# Patient Record
Sex: Male | Born: 1945 | Race: White | Hispanic: No | Marital: Married | Smoking: Never smoker
Health system: Southern US, Community
[De-identification: ages and names within clinical notes are randomized; demographics above are authoritative.]

## PROBLEM LIST (undated history)

## (undated) DIAGNOSIS — L570 Actinic keratosis: Secondary | ICD-10-CM

## (undated) HISTORY — PX: CHOLECYSTECTOMY: SHX55

## (undated) HISTORY — DX: Actinic keratosis: L57.0

---

## 2011-10-01 DIAGNOSIS — R972 Elevated prostate specific antigen [PSA]: Secondary | ICD-10-CM | POA: Insufficient documentation

## 2018-03-22 ENCOUNTER — Emergency Department: Payer: Medicare HMO

## 2018-03-22 ENCOUNTER — Emergency Department
Admission: EM | Admit: 2018-03-22 | Discharge: 2018-03-22 | Disposition: A | Discharge status: Home / Self Care | Payer: Medicare HMO | Attending: Emergency Medicine | Admitting: Emergency Medicine

## 2018-03-22 ENCOUNTER — Encounter: Payer: Self-pay | Admitting: Emergency Medicine

## 2018-03-22 DIAGNOSIS — M62830 Muscle spasm of back: Secondary | ICD-10-CM | POA: Diagnosis not present

## 2018-03-22 DIAGNOSIS — R1031 Right lower quadrant pain: Secondary | ICD-10-CM | POA: Diagnosis present

## 2018-03-22 LAB — URINALYSIS, COMPLETE (UACMP) WITH MICROSCOPIC
Bacteria, UA: NONE SEEN
Bilirubin Urine: NEGATIVE
GLUCOSE, UA: NEGATIVE mg/dL
Hgb urine dipstick: NEGATIVE
Ketones, ur: NEGATIVE mg/dL
Leukocytes, UA: NEGATIVE
Nitrite: NEGATIVE
PROTEIN: NEGATIVE mg/dL
SPECIFIC GRAVITY, URINE: 1.015 (ref 1.005–1.030)
Squamous Epithelial / LPF: NONE SEEN (ref 0–5)
pH: 5 (ref 5.0–8.0)

## 2018-03-22 LAB — CBC
HEMATOCRIT: 37.4 % — AB (ref 40.0–52.0)
HEMOGLOBIN: 13.1 g/dL (ref 13.0–18.0)
MCH: 31.3 pg (ref 26.0–34.0)
MCHC: 35.1 g/dL (ref 32.0–36.0)
MCV: 89 fL (ref 80.0–100.0)
Platelets: 151 10*3/uL (ref 150–440)
RBC: 4.2 MIL/uL — ABNORMAL LOW (ref 4.40–5.90)
RDW: 14.5 % (ref 11.5–14.5)
WBC: 6.2 10*3/uL (ref 3.8–10.6)

## 2018-03-22 LAB — BASIC METABOLIC PANEL
Anion gap: 8 (ref 5–15)
BUN: 21 mg/dL — ABNORMAL HIGH (ref 6–20)
CHLORIDE: 105 mmol/L (ref 101–111)
CO2: 25 mmol/L (ref 22–32)
Calcium: 8.9 mg/dL (ref 8.9–10.3)
Creatinine, Ser: 0.95 mg/dL (ref 0.61–1.24)
GFR calc Af Amer: >60 mL/min (ref >60)
GLUCOSE: 104 mg/dL — AB (ref 65–99)
POTASSIUM: 4.2 mmol/L (ref 3.5–5.1)
SODIUM: 138 mmol/L (ref 135–145)

## 2018-03-22 IMAGING — CT CT RENAL STONE PROTOCOL
2 of 4 series · 16 of 46 positions shown, 18 images · non-contrast
Comparison: None.

CLINICAL DATA: Right flank pain

EXAM:
CT ABDOMEN AND PELVIS WITHOUT CONTRAST
TECHNIQUE: Multidetector CT imaging of the abdomen and pelvis was performed
following the standard protocol without IV contrast.

[Series 2: stone full standard · axial · 0.80mm/px · z∈[-1152,-702]mm · 13 of 100 slices shown, 15 images]
[im 5/100  soft-tissue]
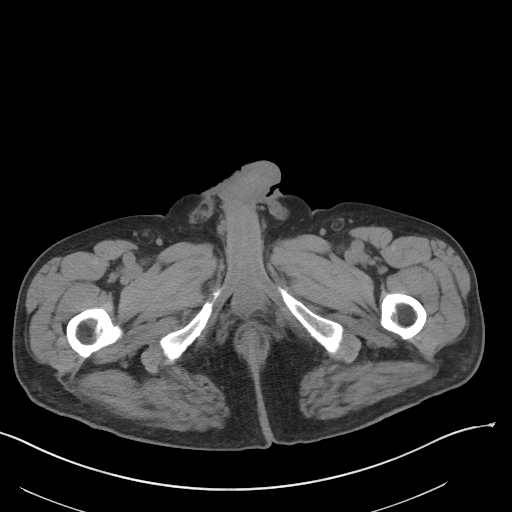
[im 5/100  bone]
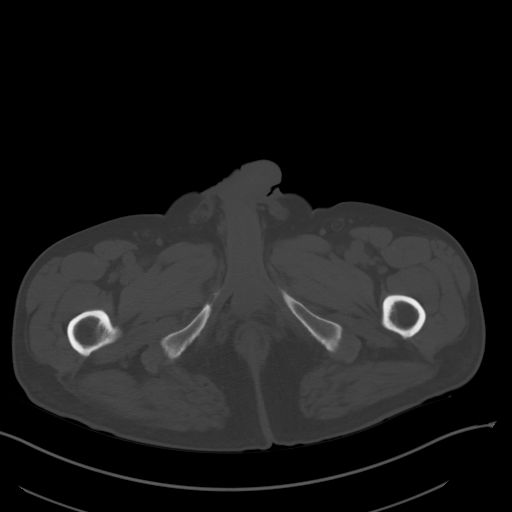
[im 13/100  soft-tissue]
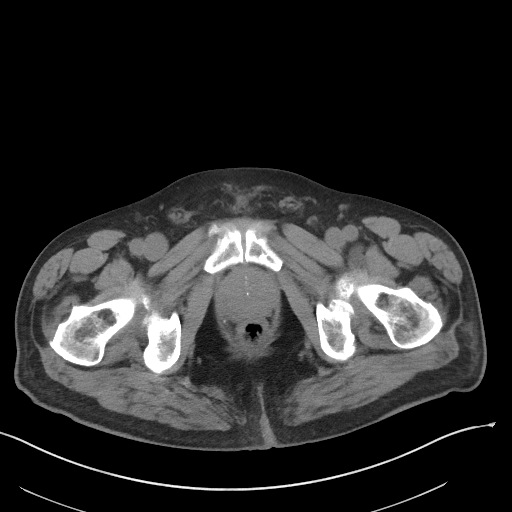
[im 21/100  soft-tissue]
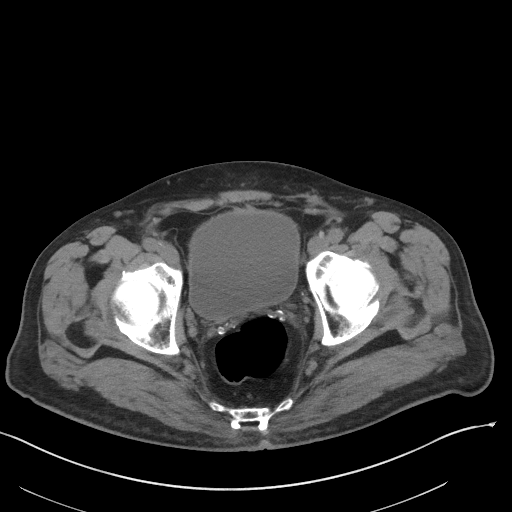
[im 29/100  soft-tissue]
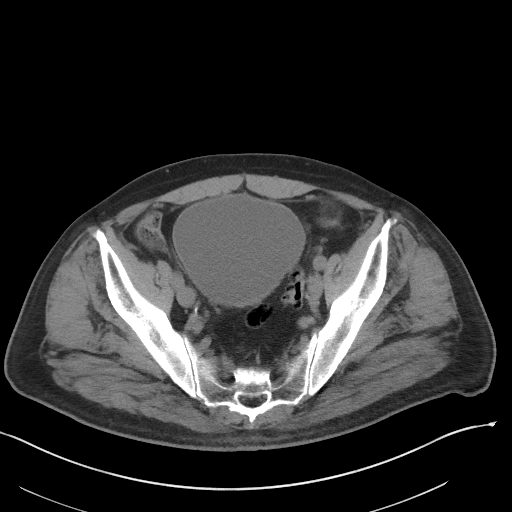
[im 34/100  soft-tissue]
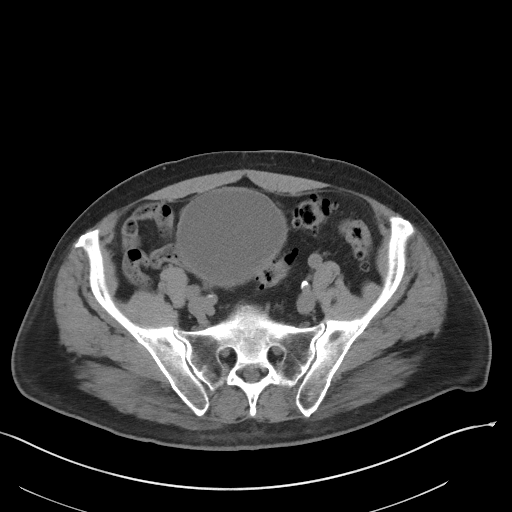
[im 42/100  soft-tissue]
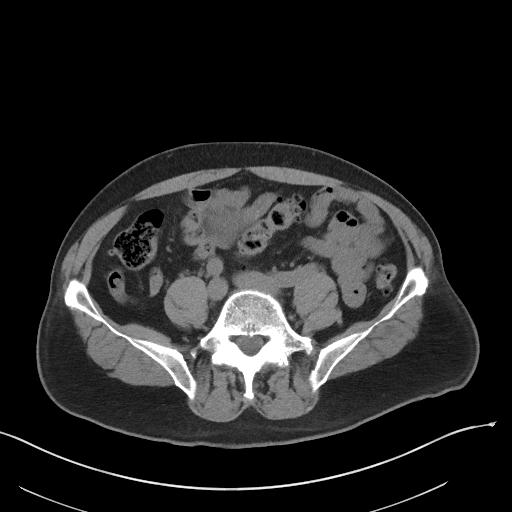
[im 50/100  soft-tissue]
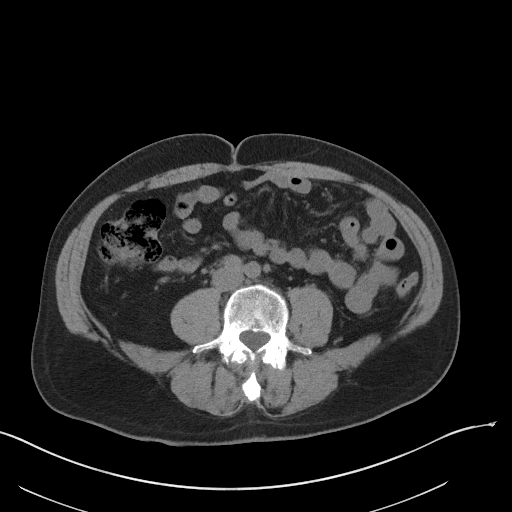
[im 58/100  soft-tissue]
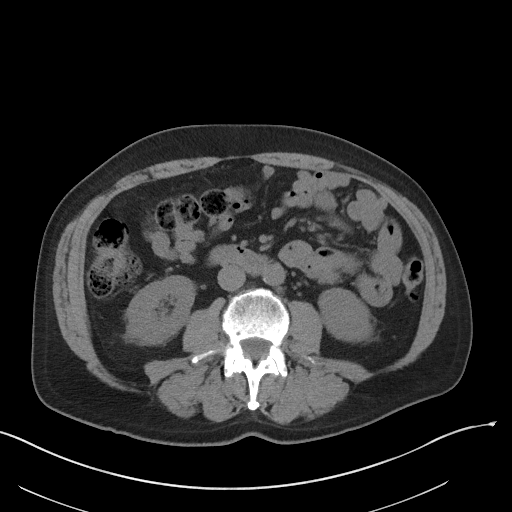
[im 67/100  soft-tissue]
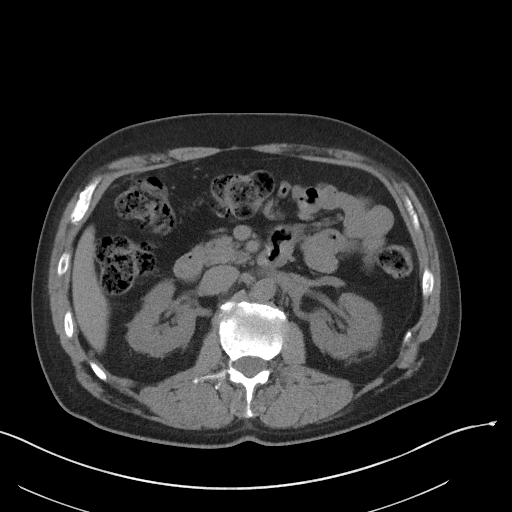
[im 67/100  bone]
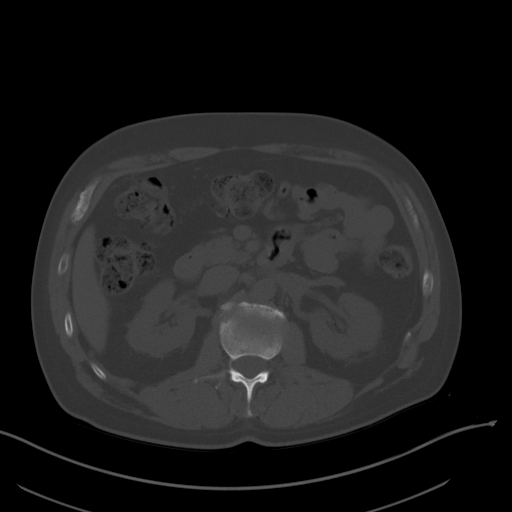
[im 71/100  soft-tissue]
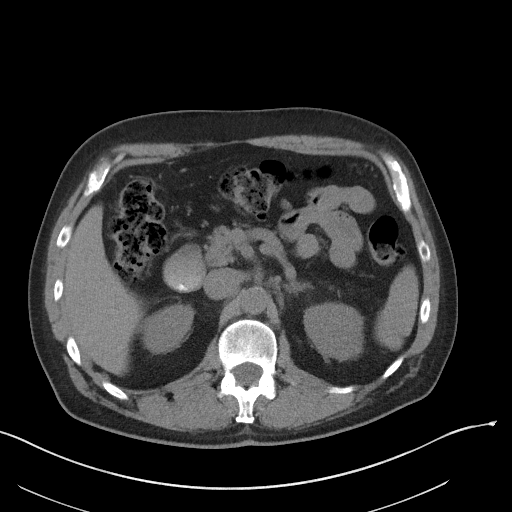
[im 79/100  soft-tissue]
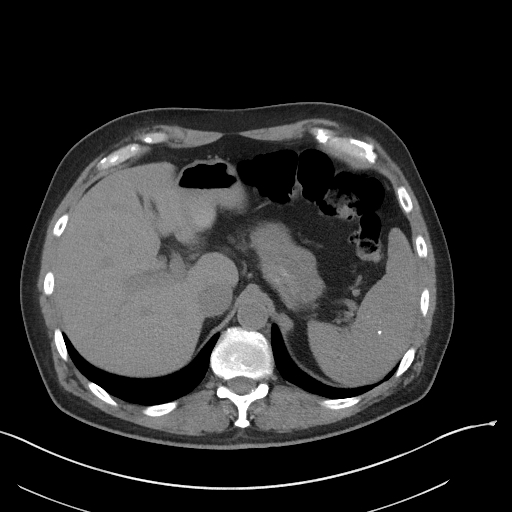
[im 87/100  soft-tissue]
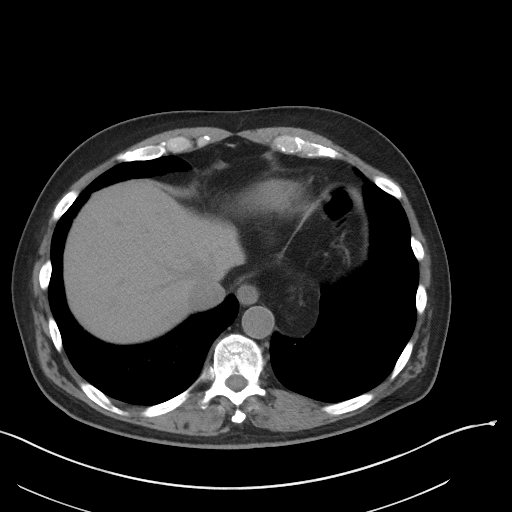
[im 95/100  soft-tissue]
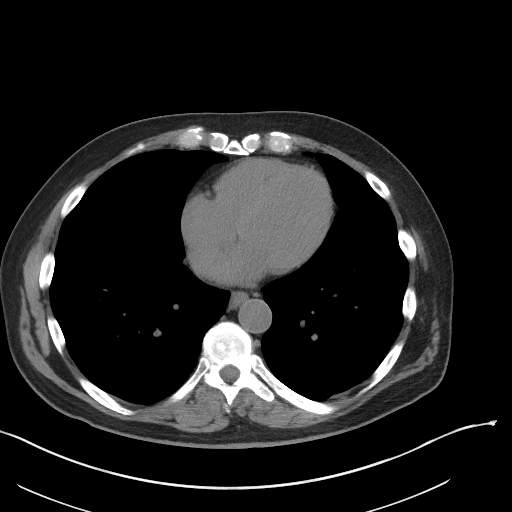

[Series 5: coronal · coronal · 0.84mm/px · 3 of 129 slices shown]
[im 43/129  soft-tissue]
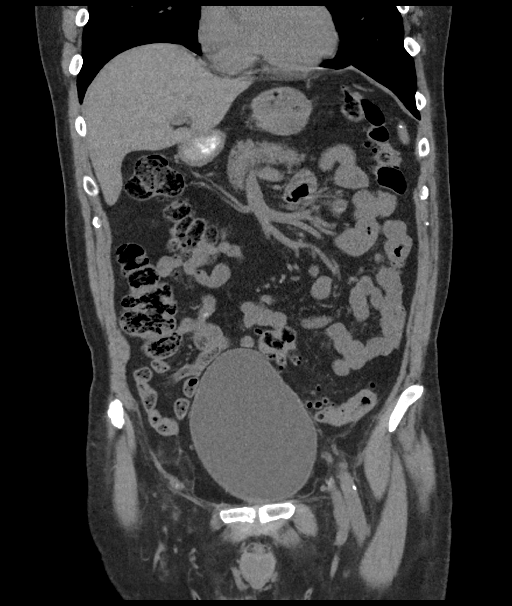
[im 57/129  soft-tissue]
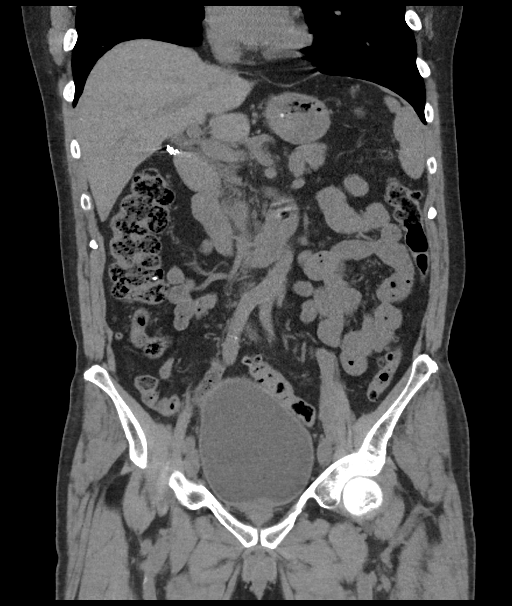
[im 72/129  soft-tissue]
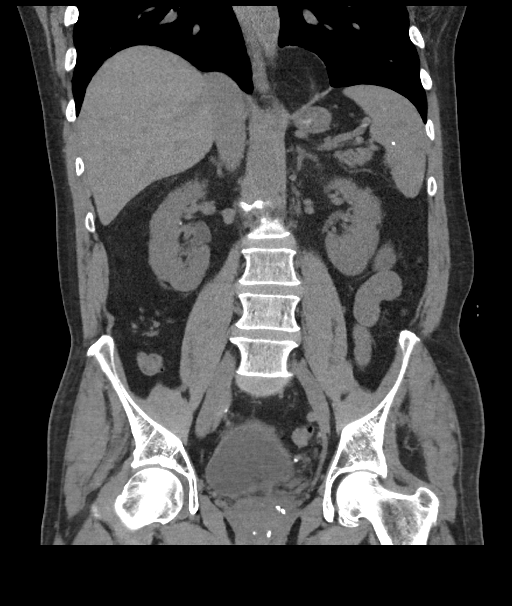

[16 of 46 positions shown; findings below may reference images not displayed]

FINDINGS: Lower chest: Lung bases are clear. No effusions. Heart is normal
size.

Hepatobiliary: No focal liver abnormality is seen. Status post
cholecystectomy. No biliary dilatation.

Pancreas: No focal abnormality or ductal dilatation.

Spleen: Calcifications throughout the spleen.  Normal size.

Adrenals/Urinary Tract: Punctate nonobstructing stone in the midpole
of the right kidney. No hydronephrosis. No ureteral stones. Urinary
bladder and adrenal glands unremarkable.

Stomach/Bowel: Normal appendix. Diffuse colonic diverticulosis.
Slight stranding around the proximal sigmoid colon anteriorly in the
left lower quadrant could reflect early acute diverticulitis.
Stomach and small bowel decompressed, unremarkable.

Vascular/Lymphatic: No evidence of aneurysm or adenopathy.

Reproductive: Prostate enlargement with central calcifications.

Other: No free fluid or free air.

Musculoskeletal: No acute bony abnormality.
IMPRESSION: Punctate right nephrolithiasis. No ureteral stones or
hydronephrosis.

Diffuse colonic diverticulosis, most pronounced in the left colon.
Slight haziness noted anteriorly adjacent to the proximal sigmoid
colon in the left lower quadrant could reflect early active
diverticulitis.

Prostate enlargement with calcifications.

Old granulomatous disease of the spleen.

## 2018-03-22 MED ORDER — OXYCODONE-ACETAMINOPHEN 5-325 MG PO TABS
ORAL_TABLET | ORAL | Status: AC
  Administered 2018-03-22: 1 via ORAL
  Filled 2018-03-22: qty 1

## 2018-03-22 MED ORDER — OXYCODONE-ACETAMINOPHEN 5-325 MG PO TABS
1.0000 | ORAL_TABLET | Freq: Once | ORAL | Status: AC
  Administered 2018-03-22: 1 via ORAL

## 2018-03-22 MED ORDER — KETOROLAC TROMETHAMINE 30 MG/ML IJ SOLN
30.0000 mg | Freq: Once | INTRAMUSCULAR | Status: AC
  Administered 2018-03-22: 30 mg via INTRAMUSCULAR
  Filled 2018-03-22: qty 1

## 2018-03-22 MED ORDER — CYCLOBENZAPRINE HCL 5 MG PO TABS: 5.0000 mg | ORAL_TABLET | Freq: Three times a day (TID) | ORAL | 0 refills | Status: DC | PRN

## 2018-03-22 NOTE — ED Provider Notes (Signed)
[facility] Emergency Department Provider Note  ____________________________________________   I have reviewed the triage vital signs and the nursing notes. Where available I have reviewed prior notes and, if possible and indicated, outside hospital notes.    HISTORY  Chief Complaint Flank Pain    HPI Vincent Knapp is a 72 y.o. male who presents today with back spasms in the lower back.  No radicular symptoms.  No radiation.  No numbness no weakness no abdominal pain, he is pain-free unless he moves wrong way that he is a sharp pain.  Is in the lower right back.  He states it began gradually 2 days ago.  Initially Tylenol helped but now Tylenol is not helping.  A very brief spasms of pain depending on awkward motion.  No recollected trauma but the day before it started he was doing a weight regimen and he feels that he "tweaked" his back.  He has had back pain before but not to this extent.  He denies any incontinence of bowel or bladder or any other new or worrisome symptoms of that variety.  Nothing makes it better except for staying still nothing makes it worse except for moving the wrong way.    History reviewed. No pertinent past medical history.  There are no active problems to display for this patient.   Past Surgical History:  Procedure Laterality Date  . CHOLECYSTECTOMY      Prior to Admission medications   Not on File    Allergies Patient has no allergy information on record.  No family history on file.  Social History Social History   Tobacco Use  . Smoking status: Never Smoker  . Smokeless tobacco: Never Used  Substance Use Topics  . Alcohol use: Yes  . Drug use: Never    Review of Systems Constitutional: No fever/chills Eyes: No visual changes. ENT: No sore throat. No stiff neck no neck pain Cardiovascular: Denies chest pain. Respiratory: Denies shortness of breath. Gastrointestinal:   no vomiting.  No diarrhea.  No  constipation. Genitourinary: Negative for dysuria. Musculoskeletal: Negative lower extremity swelling Skin: Negative for rash. Neurological: Negative for severe headaches, focal weakness or numbness.   ____________________________________________   PHYSICAL EXAM:  VITAL SIGNS: ED Triage Vitals  Enc Vitals Group     BP 03/22/18 1840 (!) 141/73     Pulse Rate 03/22/18 1840 60     Resp 03/22/18 1840 16     Temp 03/22/18 1840 98 F (36.7 C)     Temp Source 03/22/18 1840 Oral     SpO2 03/22/18 1840 100 %     Weight 03/22/18 1838 166 lb (75.3 kg)     Height 03/22/18 1838 5' 6.5" (1.689 m)     Head Circumference --      Peak Flow --      Pain Score 03/22/18 1837 6     Pain Loc --      Pain Edu? --      Excl. in [place]? --     Constitutional: Alert and oriented. Well appearing and in no acute distress. Eyes: Conjunctivae are normal Head: Atraumatic HEENT: No congestion/rhinnorhea. Mucous membranes are moist.  Oropharynx non-erythematous Neck:   Nontender with no meningismus, no masses, no stridor Cardiovascular: Normal rate, regular rhythm. Grossly normal heart sounds.  Good peripheral circulation. Respiratory: Normal respiratory effort.  No retractions. Lungs CTAB. Abdominal: Soft and nontender. No distention. No guarding no rebound no AAA palpated Back: Tenderness palpation the in the paraspinal  muscles to the left side of the back, there is no midline tenderness there is no shingles there is no erythema there is no abscess or lesions noted.  This exactly reproduces his pain.  Pain is also reproduced when he changes position in the bed in the wrong way.  There is no midline tenderness there are no lesions noted. there is no CVA tenderness Normal external male genitalia Musculoskeletal: No lower extremity tenderness, no upper extremity tenderness. No joint effusions, no DVT signs strong distal pulses no edema Neurologic:  Normal speech and language. No gross focal neurologic deficits  are appreciated.  Skin:  Skin is warm, dry and intact. No rash noted. Psychiatric: Mood and affect are normal. Speech and behavior are normal.  ____________________________________________   LABS (all labs ordered are listed, but only abnormal results are displayed)  Labs Reviewed  URINALYSIS, COMPLETE (UACMP) WITH MICROSCOPIC - Abnormal; Notable for the following components:      Result Value   Color, Urine YELLOW (*)    APPearance CLEAR (*)    All other components within normal limits  BASIC METABOLIC PANEL - Abnormal; Notable for the following components:   Glucose, Bld 104 (*)    BUN 21 (*)    All other components within normal limits  CBC - Abnormal; Notable for the following components:   RBC 4.20 (*)    HCT 37.4 (*)    All other components within normal limits    Pertinent labs  results that were available during my care of the patient were reviewed by me and considered in my medical decision making (see chart for details). ____________________________________________  EKG  I personally interpreted any EKGs ordered by me or triage  ____________________________________________  RADIOLOGY  Pertinent labs & imaging results that were available during my care of the patient were reviewed by me and considered in my medical decision making (see chart for details). If possible, patient and/or family made aware of any abnormal findings.  Ct Renal Stone Study  Result Date: 03/22/2018 CLINICAL DATA:  Right flank pain EXAM: CT ABDOMEN AND PELVIS WITHOUT CONTRAST TECHNIQUE: Multidetector CT imaging of the abdomen and pelvis was performed following the standard protocol without IV contrast. COMPARISON:  None. FINDINGS: Lower chest: Lung bases are clear. No effusions. Heart is normal size. Hepatobiliary: No focal liver abnormality is seen. Status post cholecystectomy. No biliary dilatation. Pancreas: No focal abnormality or ductal dilatation. Spleen: Calcifications throughout the  spleen.  Normal size. Adrenals/Urinary Tract: Punctate nonobstructing stone in the midpole of the right kidney. No hydronephrosis. No ureteral stones. Urinary bladder and adrenal glands unremarkable. Stomach/Bowel: Normal appendix. Diffuse colonic diverticulosis. Slight stranding around the proximal sigmoid colon anteriorly in the left lower quadrant could reflect early acute diverticulitis. Stomach and small bowel decompressed, unremarkable. Vascular/Lymphatic: No evidence of aneurysm or adenopathy. Reproductive: Prostate enlargement with central calcifications. Other: No free fluid or free air. Musculoskeletal: No acute bony abnormality. IMPRESSION: Punctate right nephrolithiasis. No ureteral stones or hydronephrosis. Diffuse colonic diverticulosis, most pronounced in the left colon. Slight haziness noted anteriorly adjacent to the proximal sigmoid colon in the left lower quadrant could reflect early active diverticulitis. Prostate enlargement with calcifications. Old granulomatous disease of the spleen. Electronically Signed   By: Rolm Baptise M.D.   On: 03/22/2018 20:16   ____________________________________________    PROCEDURES  Procedure(s) performed: None  Procedures  Critical Care performed: None  ____________________________________________   INITIAL IMPRESSION / ASSESSMENT AND PLAN / ED COURSE  Pertinent labs & imaging results  that were available during my care of the patient were reviewed by me and considered in my medical decision making (see chart for details).  Here with back spasms that come when he changes position the wrong way after lifting weights 2 days ago with no midline tenderness no radicular symptoms no vascular compromise indicated, normal neurologic exam with no saddle anesthesia no incontinence of bowel or bladder nothing to suggest cauda equina syndrome, no abdominal pain, nothing to suggest AAA but given his age I did a CT scan which is reassuring.  Nothing to  suggest diverticulitis clinically.  Patient is feeling much better after pain medication he still having occasional spasms but is feeling better we will send him home with muscle relaxers, I have advised him to be careful with muscle relaxers, and advised nonsteroidal pain medications to be taken with food and a ginger manner as needed.  Patient very comfortable with this.  There is nothing to suggest oncologic process or other life-threatening disease process return precautions and follow-up given and understood however    ____________________________________________   FINAL CLINICAL IMPRESSION(S) / ED DIAGNOSES  Final diagnoses:  None      This chart was dictated using voice recognition software.  Despite best efforts to proofread,  errors can occur which can change meaning.      Schuyler Amor, MD 03/22/18 2126

## 2018-03-22 NOTE — ED Triage Notes (Signed)
First nurse note-flank pain, feels like kidney stone. Appears uncomfortable but no current distress.

## 2018-03-22 NOTE — ED Triage Notes (Signed)
PT arrived with concerns over episodes of sharp flank pain. PT stated it started "a few days ago." Pt attempted OTC medication but had no relief. Pt in NAD in triage.

## 2018-03-22 NOTE — Discharge Instructions (Signed)
If you have any numbness, weakness, increased pain, abdominal pain, fever, vomiting, incontinence of bowel or bladder, or any other new or worrisome symptoms return to the emergency department.  Follow closely with primary care doctor.  Take the muscle relaxers with ibuprofen as directed.  Take the ibuprofen with food.  Take the minimal amount necessary.  You may also take Tylenol.  Do not drink or drive on muscle relaxers as they can cause you to be somnolent.

## 2018-03-22 NOTE — ED Notes (Signed)
ED Provider at bedside. 

## 2019-03-07 ENCOUNTER — Ambulatory Visit: Payer: Self-pay | Admitting: Podiatry

## 2019-12-28 ENCOUNTER — Ambulatory Visit: Payer: Medicare HMO | Attending: Internal Medicine

## 2019-12-28 DIAGNOSIS — Z23 Encounter for immunization: Secondary | ICD-10-CM | POA: Insufficient documentation

## 2019-12-28 NOTE — Progress Notes (Signed)
   Covid-19 Vaccination Clinic  Name:  Vincent Knapp    MRN: [id number] DOB: January 16, 1946  12/28/2019  [name] was observed post Covid-19 immunization for 15 minutes without incidence. He was provided with Vaccine Information Sheet and instruction to access the V-Safe system.   [name] was instructed to call 911 with any severe reactions post vaccine: Marland Kitchen Difficulty breathing  . Swelling of your face and throat  . A fast heartbeat  . A bad rash all over your body  . Dizziness and weakness    Immunizations Administered    Name Date Dose VIS Date Route   Pfizer COVID-19 Vaccine 12/28/2019  1:44 PM 0.3 mL 10/12/2019 Intramuscular   Manufacturer: [place]   Lot: [id number]   [place]: [id number]

## 2020-01-23 ENCOUNTER — Ambulatory Visit: Payer: Medicare HMO | Attending: Internal Medicine

## 2020-01-23 DIAGNOSIS — Z23 Encounter for immunization: Secondary | ICD-10-CM

## 2020-01-23 NOTE — Progress Notes (Signed)
   Covid-19 Vaccination Clinic  Name:  Vincent Knapp    MRN: [id number] DOB: Sep 06, 1946  01/23/2020  [name] was observed post Covid-19 immunization for 15 minutes without incident. He was provided with Vaccine Information Sheet and instruction to access the V-Safe system.   [name] was instructed to call 911 with any severe reactions post vaccine: Marland Kitchen Difficulty breathing  . Swelling of face and throat  . A fast heartbeat  . A bad rash all over body  . Dizziness and weakness   Immunizations Administered    Name Date Dose VIS Date Route   Pfizer COVID-19 Vaccine 01/23/2020  9:19 AM 0.3 mL 10/12/2019 Intramuscular   Manufacturer: [place]   Lot: [id number]   [place]: [id number]

## 2020-04-08 ENCOUNTER — Ambulatory Visit: Payer: Medicare HMO | Admitting: Dermatology

## 2020-04-08 ENCOUNTER — Other Ambulatory Visit: Payer: Self-pay

## 2020-04-08 DIAGNOSIS — L570 Actinic keratosis: Secondary | ICD-10-CM | POA: Diagnosis not present

## 2020-04-08 DIAGNOSIS — L578 Other skin changes due to chronic exposure to nonionizing radiation: Secondary | ICD-10-CM | POA: Diagnosis not present

## 2020-04-08 DIAGNOSIS — L821 Other seborrheic keratosis: Secondary | ICD-10-CM

## 2020-04-08 DIAGNOSIS — L814 Other melanin hyperpigmentation: Secondary | ICD-10-CM

## 2020-04-08 DIAGNOSIS — L905 Scar conditions and fibrosis of skin: Secondary | ICD-10-CM | POA: Diagnosis not present

## 2020-04-08 DIAGNOSIS — D1801 Hemangioma of skin and subcutaneous tissue: Secondary | ICD-10-CM
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
## 2020-04-08 NOTE — Patient Instructions (Addendum)
Recommend daily broad spectrum sunscreen SPF 30+ to sun-exposed areas, reapply every 2 hours as needed. Call for new or changing lesions.  Cryotherapy Aftercare  . Wash gently with soap and water everyday.   Marland Kitchen Apply Vaseline and Band-Aid daily until healed.   Seborrheic Keratosis  What causes seborrheic keratoses? Seborrheic keratoses are harmless, common skin growths that first appear during adult life.  As time goes by, more growths appear.  Some people may develop a large number of them.  Seborrheic keratoses appear on both covered and uncovered body parts.  They are not caused by sunlight.  The tendency to develop seborrheic keratoses can be inherited.  They vary in color from skin-colored to gray, brown, or even black.  They can be either smooth or have a rough, warty surface.   Seborrheic keratoses are superficial and look as if they were stuck on the skin.  Under the microscope this type of keratosis looks like layers upon layers of skin.  That is why at times the top layer may seem to fall off, but the rest of the growth remains and re-grows.    Treatment Seborrheic keratoses do not need to be treated, but can easily be removed in the office.  Seborrheic keratoses often cause symptoms when they rub on clothing or jewelry.  Lesions can be in the way of shaving.  If they become inflamed, they can cause itching, soreness, or burning.  Removal of a seborrheic keratosis can be accomplished by freezing, burning, or surgery. If any spot bleeds, scabs, or grows rapidly, please return to have it checked, as these can be an indication of a skin cancer.

## 2021-01-14 DIAGNOSIS — Z8249 Family history of ischemic heart disease and other diseases of the circulatory system: Secondary | ICD-10-CM | POA: Diagnosis not present

## 2021-01-14 DIAGNOSIS — Z809 Family history of malignant neoplasm, unspecified: Secondary | ICD-10-CM | POA: Diagnosis not present

## 2021-01-14 DIAGNOSIS — M1990 Unspecified osteoarthritis, unspecified site: Secondary | ICD-10-CM | POA: Diagnosis not present

## 2021-01-14 DIAGNOSIS — M5430 Sciatica, unspecified side: Secondary | ICD-10-CM | POA: Diagnosis not present

## 2021-01-14 DIAGNOSIS — I951 Orthostatic hypotension: Secondary | ICD-10-CM | POA: Diagnosis not present

## 2021-01-14 DIAGNOSIS — Z008 Encounter for other general examination: Secondary | ICD-10-CM | POA: Diagnosis not present

## 2021-01-14 DIAGNOSIS — Z833 Family history of diabetes mellitus: Secondary | ICD-10-CM | POA: Diagnosis not present

## 2021-01-14 DIAGNOSIS — I10 Essential (primary) hypertension: Secondary | ICD-10-CM | POA: Diagnosis not present

## 2021-02-04 DIAGNOSIS — Z20822 Contact with and (suspected) exposure to covid-19: Secondary | ICD-10-CM | POA: Diagnosis not present

## 2021-02-17 DIAGNOSIS — K219 Gastro-esophageal reflux disease without esophagitis: Secondary | ICD-10-CM | POA: Diagnosis not present

## 2021-02-17 DIAGNOSIS — R1310 Dysphagia, unspecified: Secondary | ICD-10-CM | POA: Diagnosis not present

## 2021-02-17 DIAGNOSIS — Z8371 Family history of colonic polyps: Secondary | ICD-10-CM | POA: Diagnosis not present

## 2021-04-14 ENCOUNTER — Ambulatory Visit (INDEPENDENT_AMBULATORY_CARE_PROVIDER_SITE_OTHER): Payer: Medicare HMO | Admitting: Dermatology

## 2021-04-14 ENCOUNTER — Other Ambulatory Visit: Payer: Self-pay

## 2021-04-14 DIAGNOSIS — D229 Melanocytic nevi, unspecified: Secondary | ICD-10-CM | POA: Diagnosis not present

## 2021-04-14 DIAGNOSIS — L739 Follicular disorder, unspecified: Secondary | ICD-10-CM

## 2021-04-14 DIAGNOSIS — L719 Rosacea, unspecified: Secondary | ICD-10-CM

## 2021-04-14 DIAGNOSIS — L570 Actinic keratosis: Secondary | ICD-10-CM

## 2021-04-14 DIAGNOSIS — L814 Other melanin hyperpigmentation: Secondary | ICD-10-CM

## 2021-04-14 DIAGNOSIS — L578 Other skin changes due to chronic exposure to nonionizing radiation: Secondary | ICD-10-CM | POA: Diagnosis not present

## 2021-04-14 DIAGNOSIS — Z1283 Encounter for screening for malignant neoplasm of skin: Secondary | ICD-10-CM | POA: Diagnosis not present

## 2021-04-14 DIAGNOSIS — L821 Other seborrheic keratosis: Secondary | ICD-10-CM

## 2021-04-14 MED ORDER — METRONIDAZOLE 0.75 % EX GEL: CUTANEOUS | 3 refills | Status: DC

## 2021-04-14 NOTE — Progress Notes (Signed)
Follow-Up Visit   Subjective  Vincent Knapp is a 75 y.o. male who presents for the following: Annual Exam (Patient here for UBSE. He has a history of Aks, no hx of skin cancer. He has a few spots on the scalp that are itchy at times. ).   The following portions of the chart were reviewed this encounter and updated as appropriate:        Review of Systems:  No other skin or systemic complaints except as noted in HPI or Assessment and Plan.  Objective  Well appearing patient in no apparent distress; mood and affect are within normal limits.  All skin waist up examined.  L infraocular 7.0 x 5.59mm waxy tan macule L infraocular.       Scalp x 9 (9) Erythematous thin papules/macules with gritty scale.   Frontal Scalp x 5, L temple x 1, R cheek x 1, R mid jaw x 1 (8) Brown macules, slightly waxy.       Back Follicular based papules.  Mid Supratip of Nose Mild erythema of the nose.   Assessment & Plan  Skin cancer screening performed today.  Actinic Damage - chronic, secondary to cumulative UV radiation exposure/sun exposure over time - diffuse scaly erythematous macules with underlying dyspigmentation - Recommend daily broad spectrum sunscreen SPF 30+ to sun-exposed areas, reapply every 2 hours as needed.  - Recommend staying in the shade or wearing long sleeves, sun glasses (UVA+UVB protection) and wide brim hats (4-inch brim around the entire circumference of the hat). - Call for new or changing lesions.  Lentigines - Scattered tan macules - Due to sun exposure - Benign-appering, observe - Recommend daily broad spectrum sunscreen SPF 30+ to sun-exposed areas, reapply every 2 hours as needed. - Call for any changes  Seborrheic Keratoses - Stuck-on, waxy, tan-brown papules and/or plaques  - Benign-appearing - Discussed benign etiology and prognosis. - Observe - Call for any changes  Hemangiomas - Red papules - Discussed benign nature - Observe - Call  for any changes     Seborrheic keratosis L infraocular  Stable. Benign, observe.  Pt desires removal  Discussed cosmetic procedure (cryotherapy), noncovered.  $60 for 1st lesion and $15 for each additional lesion if done on the same day.  Maximum charge $350.  One touch-up treatment included no charge. Discussed risks of treatment including dyspigmentation, small scar, and/or recurrence. Recommend daily broad spectrum sunscreen SPF 30+/photoprotection to treated areas once healed.    Destruction of lesion - L infraocular  Destruction method: cryotherapy   Informed consent: discussed and consent obtained   Lesion destroyed using liquid nitrogen: Yes   Region frozen until ice ball extended beyond lesion: Yes   Outcome: patient tolerated procedure well with no complications   Post-procedure details: wound care instructions given    AK (actinic keratosis) (9) Scalp x 9  Prior to procedure, discussed risks of blister formation, small wound, skin dyspigmentation, or rare scar following cryotherapy. Recommend Vaseline ointment to treated areas while healing.   Destruction of lesion - Scalp x 9  Destruction method: cryotherapy   Informed consent: discussed and consent obtained   Lesion destroyed using liquid nitrogen: Yes   Region frozen until ice ball extended beyond lesion: Yes   Outcome: patient tolerated procedure well with no complications   Post-procedure details: wound care instructions given    Lentigines (8) Frontal Scalp x 5, L temple x 1, R cheek x 1, R mid jaw x 1  vs Sks. Pt  desires removal  Discussed cosmetic procedure (cryotherapy), noncovered.  $60 for 1st lesion and $15 for each additional lesion if done on the same day.  Maximum charge $350.  One touch-up treatment included no charge. Discussed risks of treatment including dyspigmentation, small scar, and/or recurrence. Recommend daily broad spectrum sunscreen SPF 30+/photoprotection to treated areas once  healed.   Destruction of lesion - Frontal Scalp x 5, L temple x 1, R cheek x 1, R mid jaw x 1  Destruction method: cryotherapy   Informed consent: discussed and consent obtained   Lesion destroyed using liquid nitrogen: Yes   Region frozen until ice ball extended beyond lesion: Yes   Outcome: patient tolerated procedure well with no complications   Post-procedure details: wound care instructions given    Folliculitis Back  Benign, observe.  Due to recent heat/sweating   Rosacea Mid Supratip of Nose  Rosacea is a chronic progressive skin condition usually affecting the face of adults, causing redness and/or acne bumps. It is treatable but not curable. It sometimes affects the eyes (ocular rosacea) as well. It may respond to topical and/or systemic medication and can flare with stress, sun exposure, alcohol, exercise and some foods.  Daily application of broad spectrum spf 30+ sunscreen to face is recommended to reduce flares.  Start metronidazole 0.75% gel apply to nose, cheeks qd/bid dsp 45g 3Rf.  metroNIDAZOLE (METROGEL) 0.75 % gel - Mid Supratip of Nose Apply to cheeks and nose 1-2 times daily for rosacea.  Return in about 1 year (around 04/14/2022) for UBSE.  IJamesetta Orleans, CMA, am acting as scribe for Brendolyn Patty, MD .  Documentation: I have reviewed the above documentation for accuracy and completeness, and I agree with the above.  Brendolyn Patty MD

## 2021-04-14 NOTE — Patient Instructions (Signed)

## 2021-05-26 DIAGNOSIS — K5730 Diverticulosis of large intestine without perforation or abscess without bleeding: Secondary | ICD-10-CM | POA: Diagnosis not present

## 2021-05-26 DIAGNOSIS — K219 Gastro-esophageal reflux disease without esophagitis: Secondary | ICD-10-CM | POA: Diagnosis not present

## 2021-05-26 DIAGNOSIS — Z8371 Family history of colonic polyps: Secondary | ICD-10-CM | POA: Diagnosis not present

## 2021-05-26 DIAGNOSIS — K2289 Other specified disease of esophagus: Secondary | ICD-10-CM | POA: Diagnosis not present

## 2021-05-26 DIAGNOSIS — Z1211 Encounter for screening for malignant neoplasm of colon: Secondary | ICD-10-CM | POA: Diagnosis not present

## 2021-05-26 DIAGNOSIS — R1310 Dysphagia, unspecified: Secondary | ICD-10-CM | POA: Diagnosis not present

## 2021-05-26 DIAGNOSIS — R1319 Other dysphagia: Secondary | ICD-10-CM | POA: Diagnosis not present

## 2021-05-26 DIAGNOSIS — K449 Diaphragmatic hernia without obstruction or gangrene: Secondary | ICD-10-CM | POA: Diagnosis not present

## 2021-05-26 DIAGNOSIS — K640 First degree hemorrhoids: Secondary | ICD-10-CM | POA: Diagnosis not present

## 2021-05-27 ENCOUNTER — Other Ambulatory Visit (HOSPITAL_COMMUNITY): Payer: Self-pay | Admitting: Internal Medicine

## 2021-05-27 ENCOUNTER — Other Ambulatory Visit: Payer: Self-pay | Admitting: Internal Medicine

## 2021-05-27 DIAGNOSIS — R1310 Dysphagia, unspecified: Secondary | ICD-10-CM

## 2021-06-04 ENCOUNTER — Other Ambulatory Visit: Payer: Self-pay

## 2021-06-04 ENCOUNTER — Ambulatory Visit
Admission: RE | Admit: 2021-06-04 | Discharge: 2021-06-04 | Disposition: A | Discharge status: Home / Self Care | Payer: Medicare HMO | Source: Ambulatory Visit | Attending: Internal Medicine | Admitting: Internal Medicine

## 2021-06-04 DIAGNOSIS — R1310 Dysphagia, unspecified: Secondary | ICD-10-CM | POA: Diagnosis not present

## 2021-06-04 IMAGING — RF DG ESOPHAGUS
8 of 10 series · 14 of 22 positions shown · non-contrast
Comparison: No prior.

CLINICAL DATA: Dysphagia.

EXAM:
ESOPHOGRAM / BARIUM SWALLOW / BARIUM TABLET STUDY
TECHNIQUE: Combined double contrast and single contrast examination performed
using effervescent crystals, thick barium liquid, and thin barium
liquid. The patient was observed with fluoroscopy swallowing a 13 mm
barium sulphate tablet.
FLUOROSCOPY TIME:  Fluoroscopy Time:  0 minutes 54 seconds
Radiation Exposure Index (if provided by the fluoroscopic device):
30 point scratched at 34.5 mGy

[Series 1: fluoro_barium 2fps_bw · 0.17mm/px · 3 of 18 frames shown (1 of 7)]
[frame 3/18]
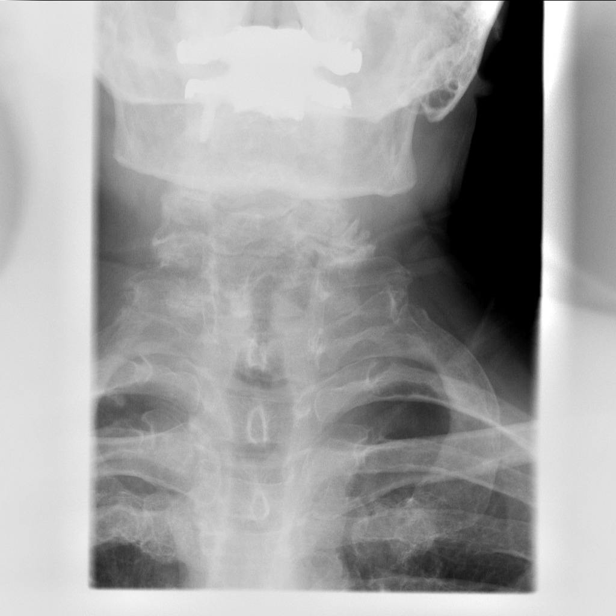
[frame 11/18]
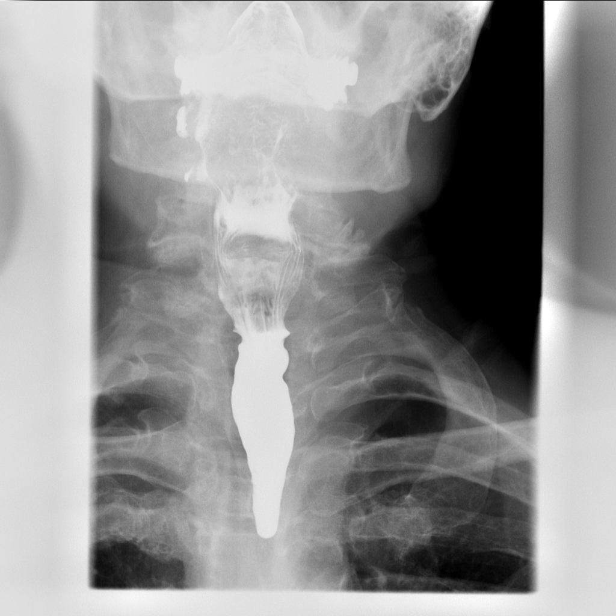
[frame 16/18]
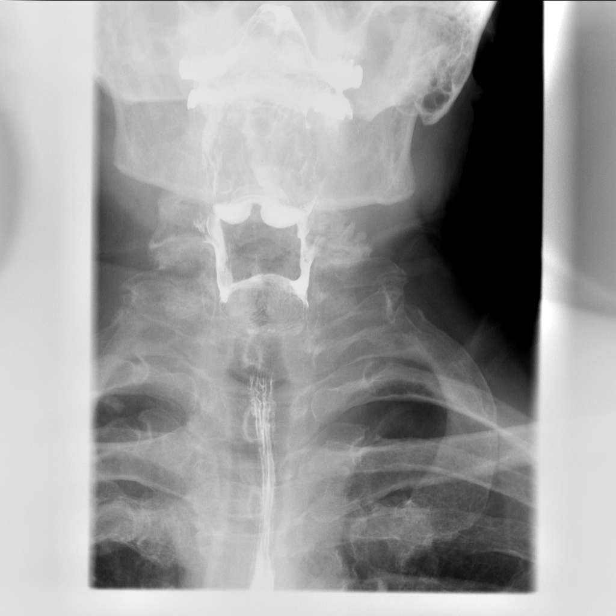

[Series 2: fluoro_barium 2fps_bw · 0.17mm/px · 2 of 8 frames shown (2 of 7)]
[frame 4/8]
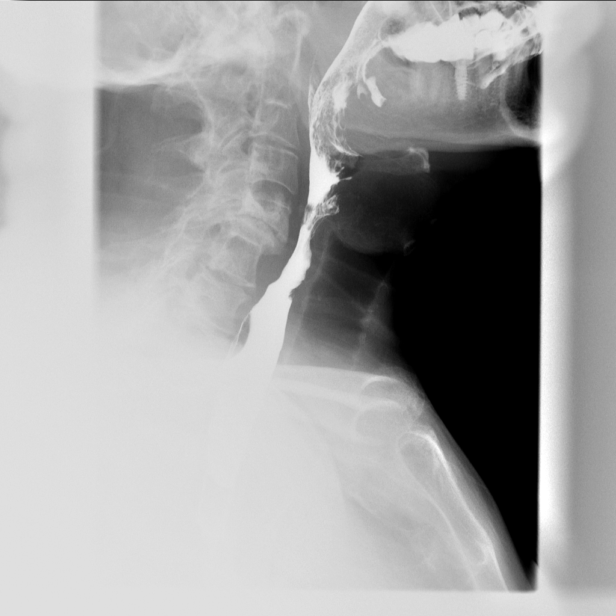
[frame 7/8]
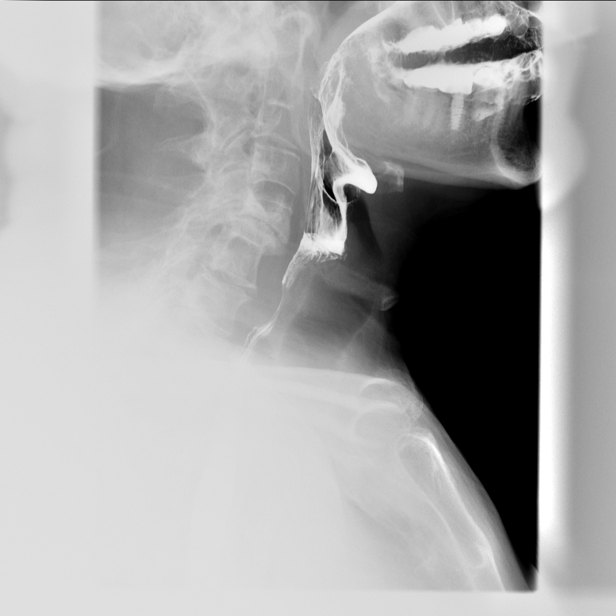

[Series 3: fluoro_barium 2fps_bw · 0.17mm/px · 1 of 1 slices shown (3 of 7)]
[im 1/1]
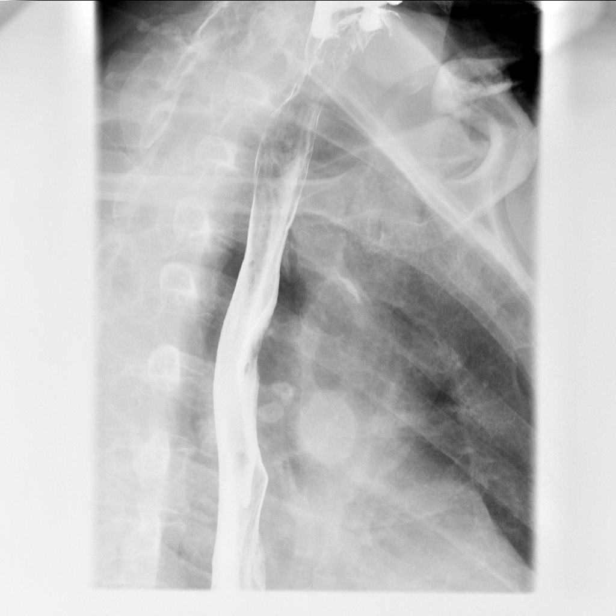

[Series 5: fluoro_barium 2fps_bw · 0.17mm/px · 1 of 1 slices shown (4 of 7)]
[im 1/1]
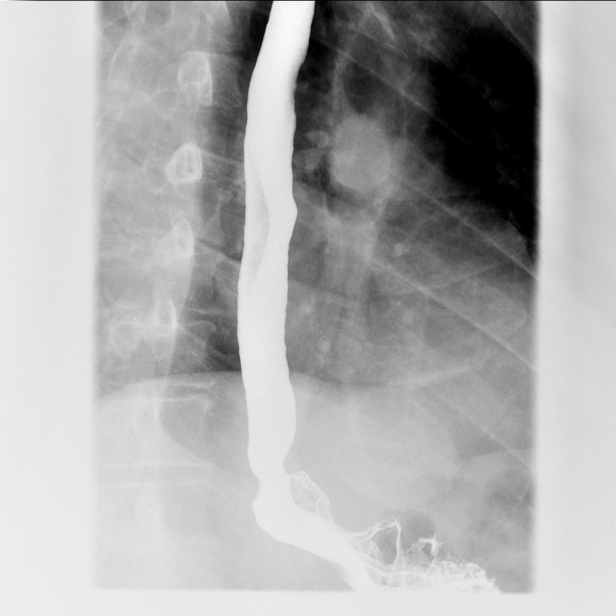

[Series 6: fluoro_barium 2fps_bw · 0.17mm/px · 1 of 2 frames shown (5 of 7)]
[frame 1/2]
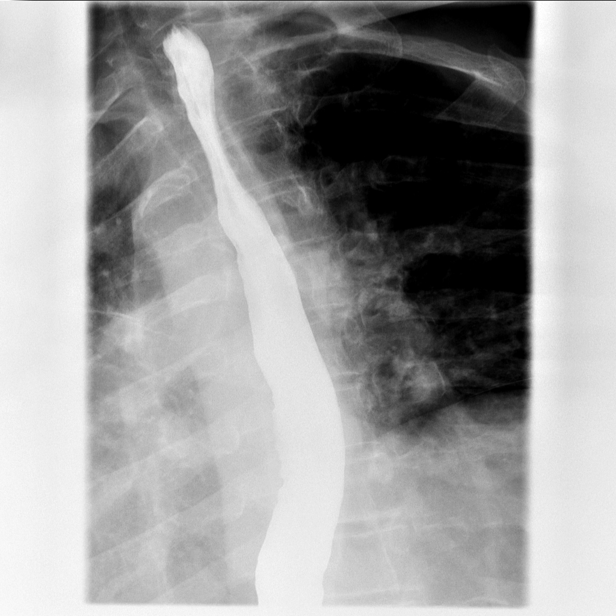

[Series 7: fluoro_barium 2fps_bw · 0.17mm/px · 1 of 1 slices shown (6 of 7)]
[im 1/1]
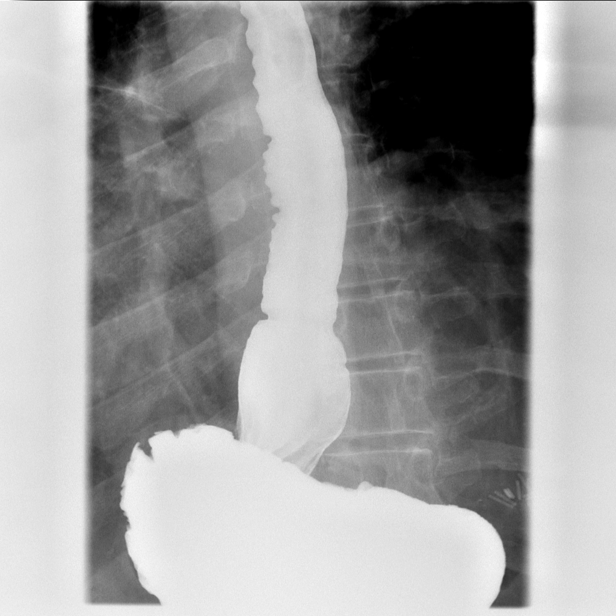

[Series 8: fluoro_barium 2fps_bw · 0.17mm/px · 2 of 3 frames shown (7 of 7)]
[frame 1/3]
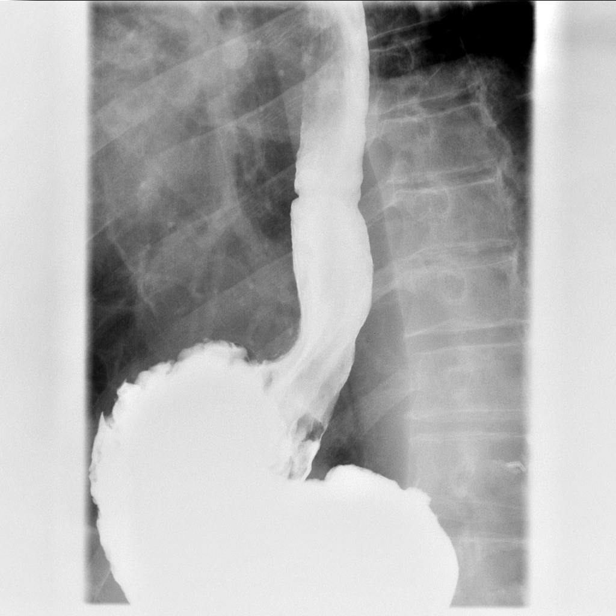
[frame 3/3]
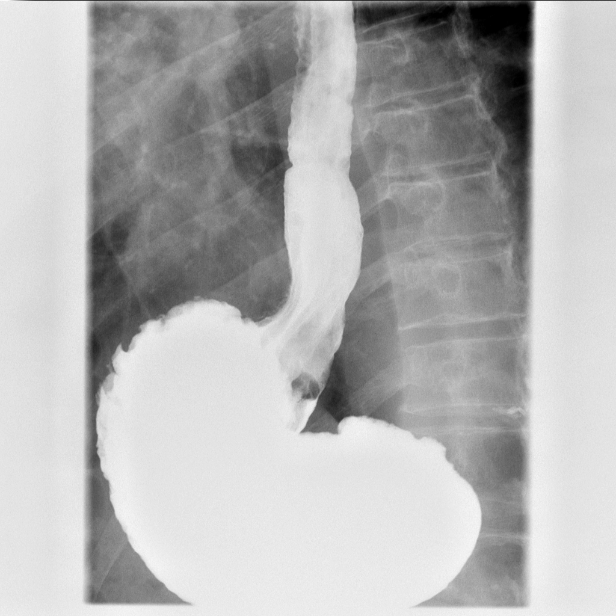

[Series 10: cp_standard · 0.17mm/px · 3 of 73 frames shown]
[frame 11/73]
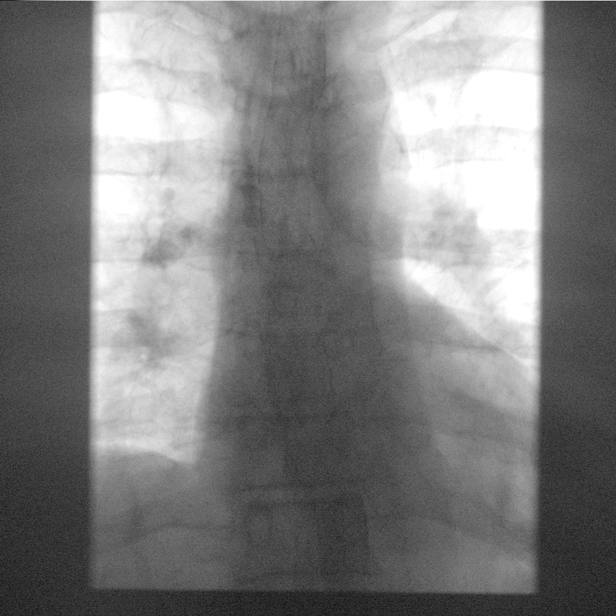
[frame 37/73]
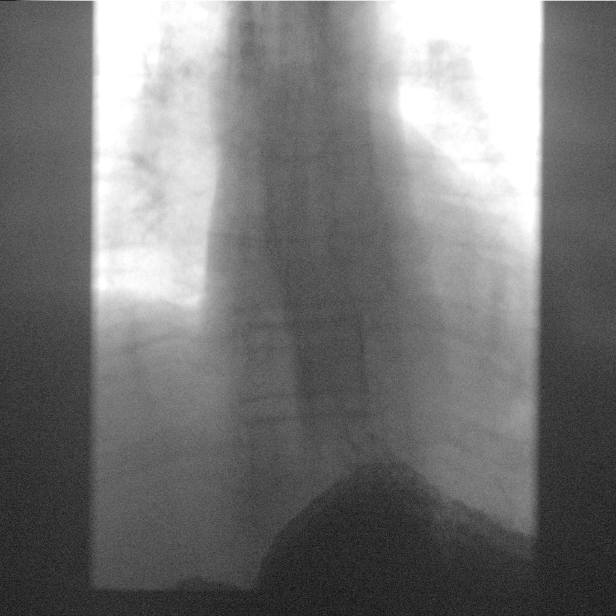
[frame 63/73]
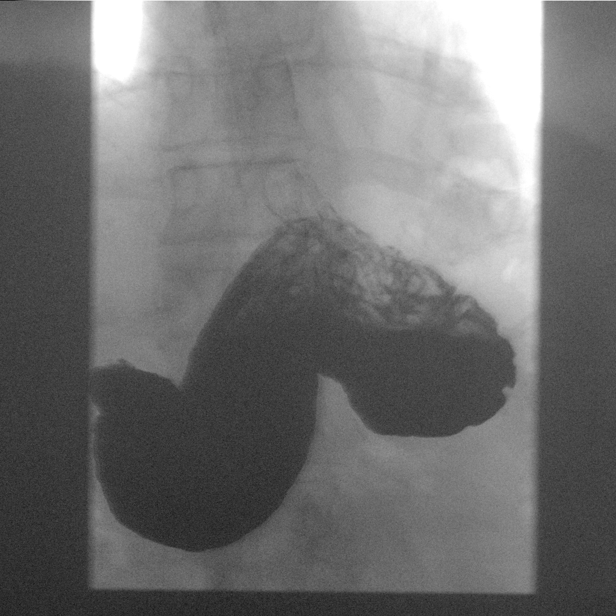

[14 of 22 positions shown; findings below may reference images not displayed]

FINDINGS: Prominent degenerative change cervical spine with mild posterior
cervical esophagus deformity. No evidence of cervical esophageal
obstruction. No evidence of aspiration. Thoracic esophagus is widely
patent. Small sliding hiatal hernia. No significant reflux. Standard
barium tablet passes normally.
IMPRESSION: 1. Prominent degenerative changes cervical spine with mild posterior
cervical esophageal deformity. No evidence of cervical or thoracic
esophageal obstruction or focal abnormality.

2. Sliding hiatal hernia. No significant reflux. Standard barium
tablet passes normally.

## 2021-07-08 ENCOUNTER — Ambulatory Visit: Payer: Medicare HMO | Admitting: Urology

## 2021-07-15 ENCOUNTER — Ambulatory Visit: Payer: Medicare HMO | Admitting: Urology

## 2021-07-15 ENCOUNTER — Other Ambulatory Visit: Payer: Self-pay

## 2021-07-15 ENCOUNTER — Encounter: Payer: Self-pay | Admitting: Urology

## 2021-07-15 VITALS — BP 147/70 | HR 75 | Ht 70.0 in | Wt 177.0 lb

## 2021-07-15 DIAGNOSIS — N402 Nodular prostate without lower urinary tract symptoms: Secondary | ICD-10-CM

## 2021-07-15 DIAGNOSIS — R972 Elevated prostate specific antigen [PSA]: Secondary | ICD-10-CM | POA: Diagnosis not present

## 2021-07-15 NOTE — Progress Notes (Signed)
   07/15/2021 10:09 AM   Vincent Knapp 1945-11-17 [id number]  Referring provider: Kirk Ruths, MD [facility] [place],  [place] [address]  Chief Complaint  Patient presents with   Elevated PSA    HPI: Vincent Knapp is a 75 y.o. male referred for evaluation of an elevated PSA.  PSA 06/11/2021 was 4.09 Baseline PSA in the mid-upper 2 range He did have a PSA bump in June 2018 at 4.29 which returned to normal on repeat in October 2018 Prior prostate biopsy in [place] approximately 8 years ago for a prostate nodule which showed no evidence of cancer Mild LUTS, primarily urinary hesitancy and decreased force and caliber of urinary stream Symptoms are not bothersome enough that he desires medical management   PMH: Past Medical History:  Diagnosis Date   Actinic keratosis     Surgical History: Past Surgical History:  Procedure Laterality Date   CHOLECYSTECTOMY      Home Medications:  Allergies as of 07/15/2021       Reactions   Codeine         Medication List        Accurate as of July 15, 2021 10:09 AM. If you have any questions, ask your nurse or doctor.          STOP taking these medications    atenolol 25 MG tablet Commonly known as: TENORMIN Stopped by: Abbie Sons, MD   metroNIDAZOLE 0.75 % gel Commonly known as: METROGEL Stopped by: Abbie Sons, MD       TAKE these medications    lisinopril 20 MG tablet Commonly known as: ZESTRIL Take by mouth. What changed: Another medication with the same name was removed. Continue taking this medication, and follow the directions you see here. Changed by: Abbie Sons, MD        Allergies:  Allergies  Allergen Reactions   Codeine     Family History: Family History  Problem Relation Age of Onset   Melanoma Brother     Social History:  reports that he has never smoked. He has never used smokeless tobacco. He reports current alcohol use. He  reports that he does not use drugs.   Physical Exam: BP (!) 147/70   Pulse 75   Ht '5\' 10"'$  (1.778 m)   Wt 177 lb (80.3 kg)   BMI 25.40 kg/m   Constitutional:  Alert and oriented, No acute distress. HEENT: [place] AT, moist mucus membranes.  Trachea midline, no masses. Cardiovascular: No clubbing, cyanosis, or edema. Respiratory: Normal respiratory effort, no increased work of breathing. GI: Abdomen is soft, nontender, nondistended, no abdominal masses GU: Prostate 45-50 g, left mid gland nodule   Assessment & Plan:    1.  Elevated PSA Mild PSA elevation Left prostate nodule however previous biopsy for nodule which was benign Repeat PSA today and if still elevated will obtain MRI prostate   Abbie Sons, MD  [facility] [address] [place],  [address] [phone]

## 2021-07-16 ENCOUNTER — Telehealth: Payer: Self-pay | Admitting: *Deleted

## 2021-07-16 ENCOUNTER — Other Ambulatory Visit: Payer: Self-pay | Admitting: Urology

## 2021-07-16 DIAGNOSIS — R972 Elevated prostate specific antigen [PSA]: Secondary | ICD-10-CM

## 2021-07-16 DIAGNOSIS — N402 Nodular prostate without lower urinary tract symptoms: Secondary | ICD-10-CM

## 2021-07-16 LAB — PSA: Prostate Specific Ag, Serum: 4.2 ng/mL — ABNORMAL HIGH (ref 0.0–4.0)

## 2021-07-16 NOTE — Telephone Encounter (Signed)
Notified patient as instructed, patient pleased.   Prostate MRI Prep:  1- No ejaculation 48 hours prior to exam  2- No food or drink or caffeine 4 hours prior to exam  3- Fleets enema needs to be done 4 hours prior to exam   4- Urinate just prior to exam     Instruction reviewed.

## 2021-07-16 NOTE — Telephone Encounter (Signed)
-----   Message from Abbie Sons, MD sent at 07/16/2021 10:39 AM EDT ----- PSA remains persistently elevated at 4.2.  Recommend scheduling a prostate MRI.  Order was entered and will call with results

## 2021-07-16 NOTE — Telephone Encounter (Signed)
Patient left a message on triage line returning your call

## 2021-07-30 ENCOUNTER — Other Ambulatory Visit: Payer: Self-pay

## 2021-07-30 ENCOUNTER — Ambulatory Visit
Admission: RE | Admit: 2021-07-30 | Discharge: 2021-07-30 | Disposition: A | Discharge status: Home / Self Care | Payer: Medicare HMO | Source: Ambulatory Visit | Attending: Urology | Admitting: Urology

## 2021-07-30 DIAGNOSIS — N3289 Other specified disorders of bladder: Secondary | ICD-10-CM | POA: Diagnosis not present

## 2021-07-30 DIAGNOSIS — R972 Elevated prostate specific antigen [PSA]: Secondary | ICD-10-CM | POA: Insufficient documentation

## 2021-07-30 DIAGNOSIS — N402 Nodular prostate without lower urinary tract symptoms: Secondary | ICD-10-CM | POA: Insufficient documentation

## 2021-07-30 DIAGNOSIS — K5730 Diverticulosis of large intestine without perforation or abscess without bleeding: Secondary | ICD-10-CM | POA: Diagnosis not present

## 2021-07-30 DIAGNOSIS — N400 Enlarged prostate without lower urinary tract symptoms: Secondary | ICD-10-CM | POA: Diagnosis not present

## 2021-07-30 IMAGING — MR MR PROSTATE WO/W CM
56 series · 56 of 56 positions shown · IV contrast (gadavist)
Comparison: None.

CLINICAL DATA: Elevated PSA level of 4.2.  Right prostate nodule.

EXAM:
MR PROSTATE WITHOUT AND WITH CONTRAST
TECHNIQUE: Multiplanar multisequence MRI images were obtained of the pelvis
centered about the prostate. Pre and post contrast images were
obtained.
CONTRAST:  8mL GADAVIST GADOBUTROL 1 MMOL/ML IV SOLN

[Series 3: ax in&out whole · axial · 3.0mm · 1.19mm/px · 1 of 88 slices shown (1 of 2)]
[im 1/88]
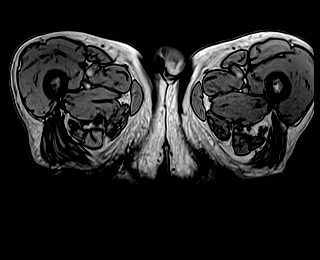

[Series 4: ax in&out whole · axial · 3.0mm · 1.19mm/px · 1 of 88 slices shown (2 of 2)]
[im 1/88]
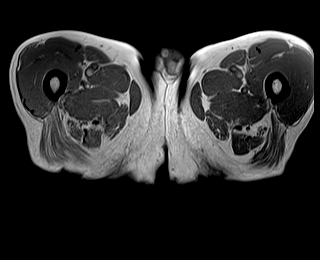

[Series 5: T2 · coronal · 3.0mm · 0.70mm/px · 1 of 35 slices shown (1 of 3)]
[im 1/35]
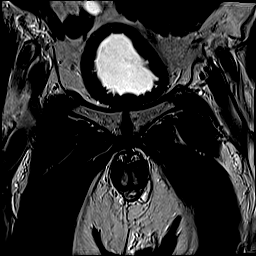

[Series 6: T2 · axial · 3.0mm · 0.56mm/px · 1 of 27 slices shown (2 of 3)]
[im 1/27]
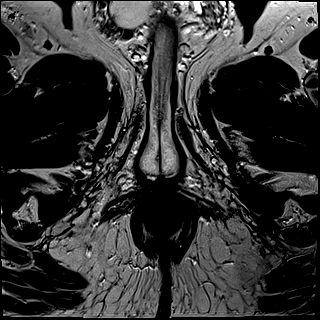

[Series 7: DWI · axial · 3.0mm · 0.86mm/px · 1 of 81 slices shown (1 of 3)]
[im 1/81]
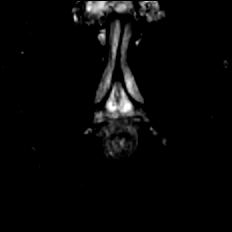

[Series 8: DWI · axial · 3.0mm · 0.86mm/px · 1 of 27 slices shown (2 of 3)]
[im 1/27]
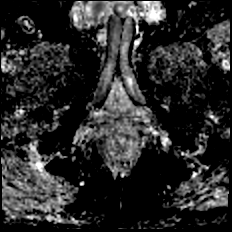

[Series 9: DWI · axial · 3.0mm · 0.86mm/px · 1 of 27 slices shown (3 of 3)]
[im 1/27]
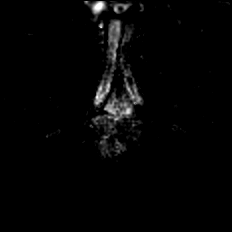

[Series 10: T2 · axial · 1.0mm · 1.04mm/px · 1 of 80 slices shown (3 of 3)]
[im 1/80]
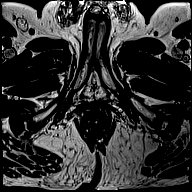

[Series 11: T1 · axial · 3.0mm · 1.15mm/px · 1 of 28 slices shown (1 of 48)]
[im 1/28]
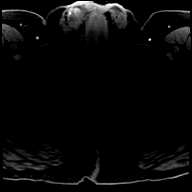

[Series 12: T1 · axial · 3.0mm · 1.15mm/px · 1 of 28 slices shown (2 of 48)]
[im 1/28]
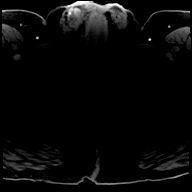

[Series 13: T1 · axial · 3.0mm · 1.15mm/px · 1 of 28 slices shown (3 of 48)]
[im 1/28]
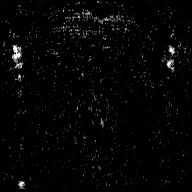

[Series 14: T1 · axial · 3.0mm · 1.15mm/px · 1 of 28 slices shown (4 of 48)]
[im 1/28]
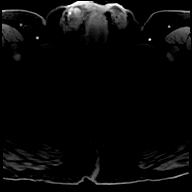

[Series 15: T1 · axial · 3.0mm · 1.15mm/px · 1 of 28 slices shown (5 of 48)]
[im 1/28]
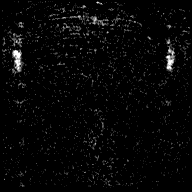

[Series 16: T1 · axial · 3.0mm · 1.15mm/px · 1 of 28 slices shown (6 of 48)]
[im 1/28]
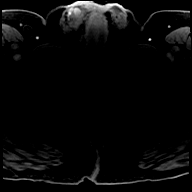

[Series 17: T1 · axial · 3.0mm · 1.15mm/px · 1 of 28 slices shown (7 of 48)]
[im 1/28]
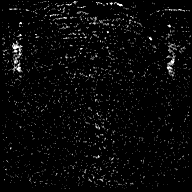

[Series 18: T1 · axial · 3.0mm · 1.15mm/px · 1 of 28 slices shown (8 of 48)]
[im 1/28]
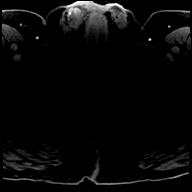

[Series 19: T1 · axial · 3.0mm · 1.15mm/px · 1 of 28 slices shown (9 of 48)]
[im 1/28]
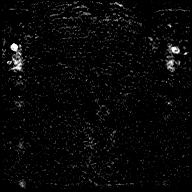

[Series 20: T1 · axial · 3.0mm · 1.15mm/px · 1 of 28 slices shown (10 of 48)]
[im 1/28]
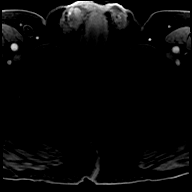

[Series 21: T1 · axial · 3.0mm · 1.15mm/px · 1 of 28 slices shown (11 of 48)]
[im 1/28]
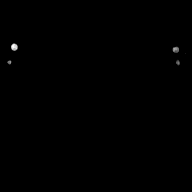

[Series 22: T1 · axial · 3.0mm · 1.15mm/px · 1 of 28 slices shown (12 of 48)]
[im 1/28]
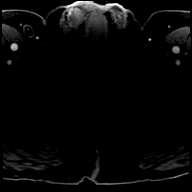

[Series 23: T1 · axial · 3.0mm · 1.15mm/px · 1 of 28 slices shown (13 of 48)]
[im 1/28]
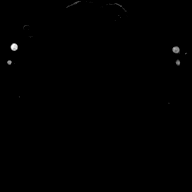

[Series 24: T1 · axial · 3.0mm · 1.15mm/px · 1 of 28 slices shown (14 of 48)]
[im 1/28]
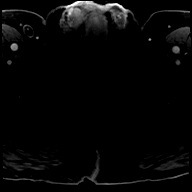

[Series 25: T1 · axial · 3.0mm · 1.15mm/px · 1 of 28 slices shown (15 of 48)]
[im 1/28]
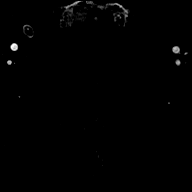

[Series 26: T1 · axial · 3.0mm · 1.15mm/px · 1 of 28 slices shown (16 of 48)]
[im 1/28]
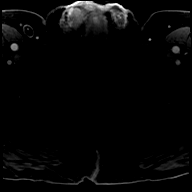

[Series 27: T1 · axial · 3.0mm · 1.15mm/px · 1 of 28 slices shown (17 of 48)]
[im 1/28]
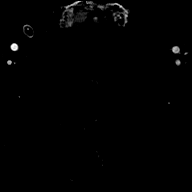

[Series 28: T1 · axial · 3.0mm · 1.15mm/px · 1 of 28 slices shown (18 of 48)]
[im 1/28]
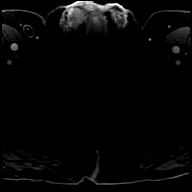

[Series 29: T1 · axial · 3.0mm · 1.15mm/px · 1 of 28 slices shown (19 of 48)]
[im 1/28]
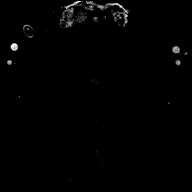

[Series 30: T1 · axial · 3.0mm · 1.15mm/px · 1 of 28 slices shown (20 of 48)]
[im 1/28]
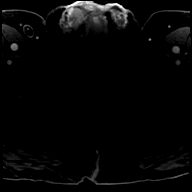

[Series 31: T1 · axial · 3.0mm · 1.15mm/px · 1 of 28 slices shown (21 of 48)]
[im 1/28]
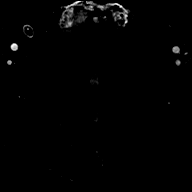

[Series 32: T1 · axial · 3.0mm · 1.15mm/px · 1 of 28 slices shown (22 of 48)]
[im 1/28]
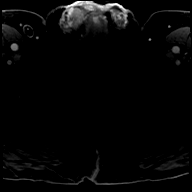

[Series 33: T1 · axial · 3.0mm · 1.15mm/px · 1 of 28 slices shown (23 of 48)]
[im 1/28]
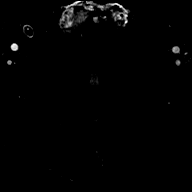

[Series 34: T1 · axial · 3.0mm · 1.15mm/px · 1 of 28 slices shown (24 of 48)]
[im 1/28]
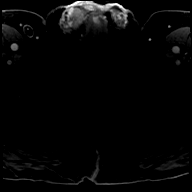

[Series 35: T1 · axial · 3.0mm · 1.15mm/px · 1 of 28 slices shown (25 of 48)]
[im 1/28]
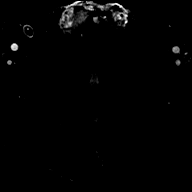

[Series 36: T1 · axial · 3.0mm · 1.15mm/px · 1 of 28 slices shown (26 of 48)]
[im 1/28]
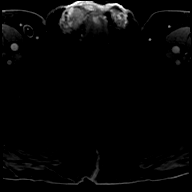

[Series 37: T1 · axial · 3.0mm · 1.15mm/px · 1 of 28 slices shown (27 of 48)]
[im 1/28]
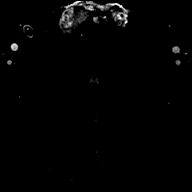

[Series 38: T1 · axial · 3.0mm · 1.15mm/px · 1 of 28 slices shown (28 of 48)]
[im 1/28]
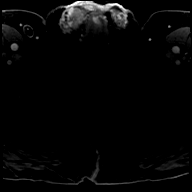

[Series 39: T1 · axial · 3.0mm · 1.15mm/px · 1 of 28 slices shown (29 of 48)]
[im 1/28]
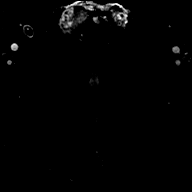

[Series 40: T1 · axial · 3.0mm · 1.15mm/px · 1 of 28 slices shown (30 of 48)]
[im 1/28]
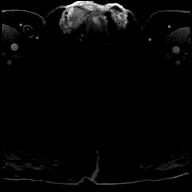

[Series 41: T1 · axial · 3.0mm · 1.15mm/px · 1 of 28 slices shown (31 of 48)]
[im 1/28]
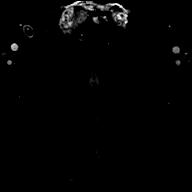

[Series 42: T1 · axial · 3.0mm · 1.15mm/px · 1 of 28 slices shown (32 of 48)]
[im 1/28]
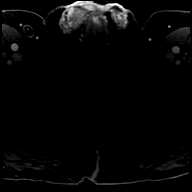

[Series 43: T1 · axial · 3.0mm · 1.15mm/px · 1 of 28 slices shown (33 of 48)]
[im 1/28]
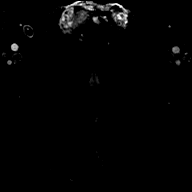

[Series 44: T1 · axial · 3.0mm · 1.15mm/px · 1 of 28 slices shown (34 of 48)]
[im 1/28]
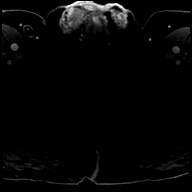

[Series 45: T1 · axial · 3.0mm · 1.15mm/px · 1 of 28 slices shown (35 of 48)]
[im 1/28]
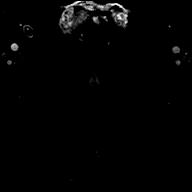

[Series 46: T1 · axial · 3.0mm · 1.15mm/px · 1 of 28 slices shown (36 of 48)]
[im 1/28]
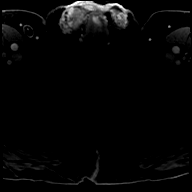

[Series 47: T1 · axial · 3.0mm · 1.15mm/px · 1 of 28 slices shown (37 of 48)]
[im 1/28]
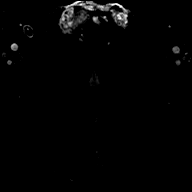

[Series 48: T1 · axial · 3.0mm · 1.15mm/px · 1 of 28 slices shown (38 of 48)]
[im 1/28]
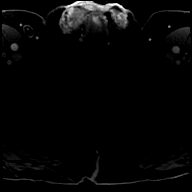

[Series 49: T1 · axial · 3.0mm · 1.15mm/px · 1 of 28 slices shown (39 of 48)]
[im 1/28]
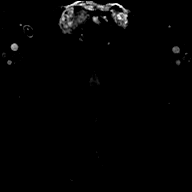

[Series 50: T1 · axial · 3.0mm · 1.15mm/px · 1 of 28 slices shown (40 of 48)]
[im 1/28]
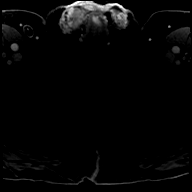

[Series 51: T1 · axial · 3.0mm · 1.15mm/px · 1 of 28 slices shown (41 of 48)]
[im 1/28]
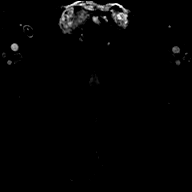

[Series 52: T1 · axial · 3.0mm · 1.15mm/px · 1 of 28 slices shown (42 of 48)]
[im 1/28]
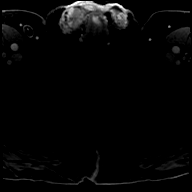

[Series 53: T1 · axial · 3.0mm · 1.15mm/px · 1 of 28 slices shown (43 of 48)]
[im 1/28]
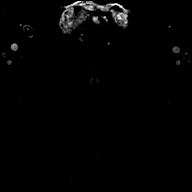

[Series 54: T1 · axial · 3.0mm · 1.15mm/px · 1 of 28 slices shown (44 of 48)]
[im 1/28]
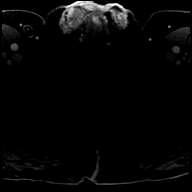

[Series 55: T1 · axial · 3.0mm · 1.15mm/px · 1 of 28 slices shown (45 of 48)]
[im 1/28]
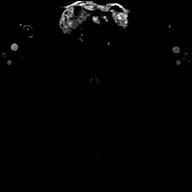

[Series 56: T1 · axial · 3.0mm · 1.15mm/px · 1 of 28 slices shown (46 of 48)]
[im 1/28]
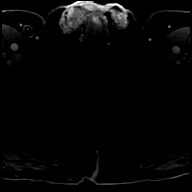

[Series 57: T1 · axial · 3.0mm · 1.15mm/px · 1 of 28 slices shown (47 of 48)]
[im 1/28]
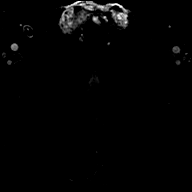

[Series 58: T1 · axial · 3.0mm · 1.15mm/px · 1 of 28 slices shown (48 of 48)]
[im 1/28]
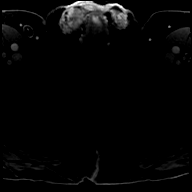

[56 of 56 positions shown; findings below may reference images not displayed]

FINDINGS: Prostate:

Encapsulated nodularity in the transition zone compatible with
benign prostatic hypertrophy. Low signal foci along portions of the
transition zone including the left posterior border of the
transition zone compatible with calcifications.

There is diffuse hazy low T2 signal stranding in the peripheral
zone, left greater than right, associated with nonfocal enhancement.
Has this encompasses nearly the entire left peripheral zone, no
specific targetable lesion on the left is observed.

Region of interest # 1: PI-RADS category 4 lesion of the right
posterolateral and posteromedial peripheral zone at the apex with
reduced T2 signal focally restricted diffusion but no early
enhancement. This measures 0.29 cc (1.4 by 0.6 by 0.6 cm) and is
shown for example on image 20 series 9 and image 59 series 10.

Volume: 3D volumetric analysis: Prostate volume 49.54 cc (5.2 by
by 4.7 cm).

Transcapsular spread:  Absent

Seminal vesicle involvement: Absent

Neurovascular bundle involvement: Absent

Pelvic adenopathy: Absent

Bone metastasis: Absent

Other findings: Descending and sigmoid colon diverticulosis.
Nonspecific anterior urinary bladder wall thickening.
IMPRESSION: 1. Small PI-RADS category 4 lesion of the right posterior peripheral
zone at the apex with focally reduced T2 signal and some restricted
diffusion. Targeting data sent to [facility].
2. Hazy diffuse low T2 signal in the peripheral zone, particularly
on the left side. This may well be postinflammatory. Given the
nonfocal appearance, no specific targetable lesion on the left is
observed.
3. Benign prostatic hypertrophy with mild prostatomegaly.
4. Descending and sigmoid colon diverticulosis.
5. Nonspecific anterior urinary bladder wall thickening.

## 2021-07-30 MED ORDER — GADOBUTROL 1 MMOL/ML IV SOLN
8.0000 mL | Freq: Once | INTRAVENOUS | Status: AC | PRN
  Administered 2021-07-30: 8 mL via INTRAVENOUS

## 2021-08-06 ENCOUNTER — Telehealth: Payer: Self-pay | Admitting: Urology

## 2021-08-06 NOTE — Telephone Encounter (Signed)
Prostate MRI did show a small area in the right prostate that was suspicious for prostate cancer.  This does not mean that he has prostate cancer but I would recommend scheduling biopsy for further evaluation.  We do not have the equipment to perform this particular biopsy in [place] and use the urology practice in [place].  Please let me know if he has any questions.  If he is agreeable to pursuing biopsy let me know and I will send the referral.

## 2021-08-07 NOTE — Telephone Encounter (Signed)
Patient notified and voiced understanding. He is willing to do the Biopsy only as long as he is put all the way to sleep. He does not want to be awake for this.

## 2021-08-14 ENCOUNTER — Telehealth: Payer: Self-pay | Admitting: Urology

## 2021-08-14 ENCOUNTER — Other Ambulatory Visit: Payer: Self-pay | Admitting: Urology

## 2021-08-14 DIAGNOSIS — R972 Elevated prostate specific antigen [PSA]: Secondary | ICD-10-CM

## 2021-08-14 NOTE — Telephone Encounter (Signed)
Vincent Knapp regarding a referral for a biopsy.

## 2021-08-14 NOTE — Telephone Encounter (Signed)
Advised patient that Vincent Knapp will be calling to schedule

## 2021-08-14 NOTE — Progress Notes (Signed)
Surgical Physician Order Form  * Scheduling expectation :  Within the next 4 weeks  *Length of Case: 30 minutes  *Clearance needed: no  *Anticoagulation Instructions: N/A  *Aspirin Instructions: N/A  *Post-op visit Date/Instructions:   Will call with biopsy results  *Diagnosis: Elevated PSA  *Procedure: Prostate Biopsy (55700) TRUS (77414) [place] ELTRV(20233) Nerve block (43568)  -Admit type: OUTpatient  -Anesthesia: MAC  -VTE Prophylaxis Standing Order SCD's       Other:   -Standing Lab Orders Per Anesthesia    Lab other: UA&Urine Culture  -Standing Test orders EKG/Chest x-ray per Anesthesia       Test other:   - Medications:     Ceftriaxone(Rocephin) 1gm IV   Other Instructions: Please schedule a preop appointment with me for H&P

## 2021-08-20 ENCOUNTER — Other Ambulatory Visit: Payer: Self-pay | Admitting: Urology

## 2021-08-20 ENCOUNTER — Telehealth: Payer: Self-pay | Admitting: Urology

## 2021-08-20 ENCOUNTER — Encounter: Payer: Self-pay | Admitting: Urology

## 2021-08-20 DIAGNOSIS — R972 Elevated prostate specific antigen [PSA]: Secondary | ICD-10-CM

## 2021-08-20 NOTE — Progress Notes (Signed)
[facility] Posting Form   Surgery Date/Time: Date: 09/15/2021  Surgeon: Dr. John Giovanni, MD  Surgery Location: Day Surgery  Inpt ( No  )   Outpt (Yes)   Obs ( No  )   Diagnosis: R97.20 Elevated PSA   -CPT: 55700 Prostate Biopsy; 27062 TRUS; 37628 US Guide; Nerve Block [id number]  Surgery:  Prostate Biopsy; TRUS; US Guide; Nerve Block   Stop Anticoagulations: No  Cardiac/Medical/Pulmonary Clearance needed: None Needed  *Orders entered into EPIC  Date: 08/20/21   *Case booked in EPIC  Date: 08/20/21  *Notified pt of Surgery: Date: 08/20/21  PRE-OP UA & CX: Yes  *Placed into Prior Authorization Work Que Date: 08/20/21   Assistant/laser/rep:No

## 2021-08-20 NOTE — Telephone Encounter (Signed)
Per Dr. Bernardo Heater Patient is to be scheduled for TRUS, Prostate Biopsy with U/S guidance  Vincent Knapp  was contacted and possible surgical dates were discussed, 09/15/21 was agreed upon for surgery. Patient was instructed that Dr. Bernardo Heater will require them to provide a pre-op UA & CX prior to surgery. This was ordered and scheduled with his preop appointment scheduled with Dr. Bernardo Heater on 09/03/21 @ 8:45.    Patient was directed to call [phone] between 1-3pm the day before surgery to find out surgical arrival time.  Instructions were given not to eat or drink from midnight on the night before surgery and have a driver for the day of surgery. On the surgery day patient was instructed to enter through the [place] entrance of [facility] report the Same Day Surgery desk.   Pre-Admit Testing will be in contact via phone to set up an interview with the anesthesia team to review your history and medications prior to surgery.   Reminder of this information was sent via Mail to the patient.

## 2021-08-28 ENCOUNTER — Other Ambulatory Visit: Payer: Self-pay

## 2021-09-03 ENCOUNTER — Other Ambulatory Visit: Payer: Self-pay

## 2021-09-03 ENCOUNTER — Encounter: Payer: Self-pay | Admitting: Urology

## 2021-09-03 ENCOUNTER — Ambulatory Visit: Payer: Medicare HMO | Admitting: Urology

## 2021-09-03 VITALS — BP 146/77 | HR 64 | Ht 70.0 in | Wt 172.0 lb

## 2021-09-03 DIAGNOSIS — N402 Nodular prostate without lower urinary tract symptoms: Secondary | ICD-10-CM | POA: Diagnosis not present

## 2021-09-03 DIAGNOSIS — R972 Elevated prostate specific antigen [PSA]: Secondary | ICD-10-CM

## 2021-09-03 LAB — URINALYSIS, COMPLETE
Bilirubin, UA: NEGATIVE
Glucose, UA: NEGATIVE
Ketones, UA: NEGATIVE
Nitrite, UA: POSITIVE — AB
Protein,UA: NEGATIVE
RBC, UA: NEGATIVE
Specific Gravity, UA: 1.015 (ref 1.005–1.030)
Urobilinogen, Ur: 0.2 mg/dL (ref 0.2–1.0)
pH, UA: 6 (ref 5.0–7.5)

## 2021-09-03 LAB — MICROSCOPIC EXAMINATION
Epithelial Cells (non renal): NONE SEEN /hpf (ref 0–10)
RBC, Urine: NONE SEEN /hpf (ref 0–2)

## 2021-09-03 NOTE — Progress Notes (Signed)
   09/03/2021 11:32 AM   Vincent Knapp May 05, 1946 [id number]  Referring provider: Kirk Ruths, MD [facility] [place],  [place] [address]  Chief Complaint  Patient presents with   Follow-up    HPI: 75 y.o. male presents for follow-up of an elevated PSA.  Initially seen 07/15/2021 for a PSA of 4.09 Baseline PSA in the mid-upper 2 range Prior biopsy ~ 8 years ago in [place] which was benign Repeat PSA persistently elevated at 4.2 and left prostate nodule DRE After discussing options he elected to proceed with a prostate MRI which showed a PI-RADS 4 lesion right posterior lateral and posteromedial peripheral zone at the apex Prostate volume calculated 49.5 cc No evidence of extracapsular extension, SV/N/V involvement or pelvic adenopathy Fusion biopsy was recommended however he contacted the office requesting he have a biopsy performed under anesthesia due to significant discomfort at his initial biopsy several years ago.  PMH: Past Medical History:  Diagnosis Date   Actinic keratosis     Surgical History: Past Surgical History:  Procedure Laterality Date   CHOLECYSTECTOMY      Home Medications:  Allergies as of 09/03/2021       Reactions   Codeine Nausea Only        Medication List        Accurate as of September 03, 2021 11:32 AM. If you have any questions, ask your nurse or doctor.          doxylamine (Sleep) 25 MG tablet Commonly known as: UNISOM Take 25 mg by mouth at bedtime.   lisinopril 20 MG tablet Commonly known as: ZESTRIL Take 20 mg by mouth at bedtime.   multivitamin with minerals Tabs tablet Take 1 tablet by mouth daily.        Allergies:  Allergies  Allergen Reactions   Codeine Nausea Only    Family History: Family History  Problem Relation Age of Onset   Melanoma Brother     Social History:  reports that he has never smoked. He has never used smokeless tobacco. He reports current alcohol  use. He reports that he does not use drugs.   Physical Exam: BP (!) 146/77   Pulse 64   Ht 5\' 10"  (1.778 m)   Wt 172 lb (78 kg)   BMI 24.68 kg/m   Constitutional:  Alert and oriented, No acute distress.   Assessment & Plan:    1. Elevated PSA PI-RADS 4 lesion on MRI He had requested biopsy be performed under anesthesia.  I informed Vincent Knapp that we are currently unable to perform fusion biopsies in [facility] and use [facility] in [place] who performs fusion biopsy in the office.  The option of nitrous oxide is available in addition to oral sedation I did discuss a cognitive biopsy could be performed here though it is possible the lesion could be missed He would prefer to have the fusion biopsy and will schedule with [facility] today did show pyuria and urine culture was ordered   Abbie Sons, [place] [address], [place], [place] [address] [phone]

## 2021-09-04 ENCOUNTER — Inpatient Hospital Stay: Admission: RE | Admit: 2021-09-04 | Payer: Medicare HMO | Source: Ambulatory Visit

## 2021-09-06 LAB — CULTURE, URINE COMPREHENSIVE

## 2021-09-08 ENCOUNTER — Encounter: Payer: Self-pay | Admitting: Urology

## 2021-09-08 ENCOUNTER — Telehealth: Payer: Self-pay | Admitting: *Deleted

## 2021-09-08 ENCOUNTER — Other Ambulatory Visit: Payer: Self-pay | Admitting: *Deleted

## 2021-09-08 MED ORDER — SULFAMETHOXAZOLE-TRIMETHOPRIM 800-160 MG PO TABS: 1.0000 | ORAL_TABLET | Freq: Two times a day (BID) | ORAL | 0 refills | Status: AC

## 2021-09-08 NOTE — Telephone Encounter (Signed)
Notified patient as instructed,.  

## 2021-09-08 NOTE — Telephone Encounter (Signed)
-----   Message from Abbie Sons, MD sent at 09/08/2021  7:29 AM EST ----- Urine culture was positive.  Please send Rx Septra DS 1 twice daily x7 days

## 2021-09-10 DIAGNOSIS — R1312 Dysphagia, oropharyngeal phase: Secondary | ICD-10-CM | POA: Diagnosis not present

## 2021-09-15 ENCOUNTER — Ambulatory Visit: Payer: Medicare HMO

## 2021-09-15 ENCOUNTER — Ambulatory Visit: Admit: 2021-09-15 | Payer: Medicare HMO | Admitting: Urology

## 2021-09-15 SURGERY — BIOPSY, PROSTATE
Anesthesia: Monitor Anesthesia Care

## 2021-09-23 ENCOUNTER — Telehealth: Payer: Medicare HMO | Admitting: Urology

## 2021-09-30 DIAGNOSIS — R972 Elevated prostate specific antigen [PSA]: Secondary | ICD-10-CM | POA: Diagnosis not present

## 2021-10-06 ENCOUNTER — Other Ambulatory Visit: Payer: Self-pay | Admitting: Urology

## 2021-10-12 DIAGNOSIS — J383 Other diseases of vocal cords: Secondary | ICD-10-CM | POA: Diagnosis not present

## 2021-10-12 DIAGNOSIS — R1314 Dysphagia, pharyngoesophageal phase: Secondary | ICD-10-CM | POA: Diagnosis not present

## 2021-10-12 DIAGNOSIS — M47812 Spondylosis without myelopathy or radiculopathy, cervical region: Secondary | ICD-10-CM | POA: Diagnosis not present

## 2022-03-11 DIAGNOSIS — M76822 Posterior tibial tendinitis, left leg: Secondary | ICD-10-CM | POA: Diagnosis not present

## 2022-03-16 ENCOUNTER — Emergency Department: Payer: Medicare HMO

## 2022-03-16 ENCOUNTER — Observation Stay
Admission: EM | Admit: 2022-03-16 | Discharge: 2022-03-17 | Disposition: A | Discharge status: Home / Self Care | Payer: Medicare HMO | Attending: Internal Medicine | Admitting: Internal Medicine

## 2022-03-16 ENCOUNTER — Encounter: Payer: Self-pay | Admitting: Radiology

## 2022-03-16 ENCOUNTER — Other Ambulatory Visit: Payer: Self-pay

## 2022-03-16 DIAGNOSIS — E663 Overweight: Secondary | ICD-10-CM | POA: Insufficient documentation

## 2022-03-16 DIAGNOSIS — R9431 Abnormal electrocardiogram [ECG] [EKG]: Secondary | ICD-10-CM | POA: Diagnosis not present

## 2022-03-16 DIAGNOSIS — E785 Hyperlipidemia, unspecified: Secondary | ICD-10-CM | POA: Insufficient documentation

## 2022-03-16 DIAGNOSIS — R29818 Other symptoms and signs involving the nervous system: Secondary | ICD-10-CM | POA: Insufficient documentation

## 2022-03-16 DIAGNOSIS — R410 Disorientation, unspecified: Secondary | ICD-10-CM | POA: Insufficient documentation

## 2022-03-16 DIAGNOSIS — Z6825 Body mass index (BMI) 25.0-25.9, adult: Secondary | ICD-10-CM | POA: Insufficient documentation

## 2022-03-16 DIAGNOSIS — I672 Cerebral atherosclerosis: Secondary | ICD-10-CM | POA: Diagnosis not present

## 2022-03-16 DIAGNOSIS — R4182 Altered mental status, unspecified: Secondary | ICD-10-CM | POA: Diagnosis not present

## 2022-03-16 DIAGNOSIS — I10 Essential (primary) hypertension: Secondary | ICD-10-CM | POA: Diagnosis not present

## 2022-03-16 DIAGNOSIS — I6782 Cerebral ischemia: Secondary | ICD-10-CM | POA: Insufficient documentation

## 2022-03-16 DIAGNOSIS — R109 Unspecified abdominal pain: Secondary | ICD-10-CM | POA: Insufficient documentation

## 2022-03-16 DIAGNOSIS — Z79899 Other long term (current) drug therapy: Secondary | ICD-10-CM | POA: Diagnosis not present

## 2022-03-16 DIAGNOSIS — K5730 Diverticulosis of large intestine without perforation or abscess without bleeding: Secondary | ICD-10-CM | POA: Diagnosis not present

## 2022-03-16 DIAGNOSIS — N390 Urinary tract infection, site not specified: Secondary | ICD-10-CM | POA: Insufficient documentation

## 2022-03-16 DIAGNOSIS — R739 Hyperglycemia, unspecified: Secondary | ICD-10-CM | POA: Insufficient documentation

## 2022-03-16 DIAGNOSIS — R7303 Prediabetes: Secondary | ICD-10-CM | POA: Diagnosis not present

## 2022-03-16 DIAGNOSIS — N400 Enlarged prostate without lower urinary tract symptoms: Secondary | ICD-10-CM | POA: Diagnosis not present

## 2022-03-16 DIAGNOSIS — G454 Transient global amnesia: Principal | ICD-10-CM | POA: Insufficient documentation

## 2022-03-16 DIAGNOSIS — R413 Other amnesia: Secondary | ICD-10-CM | POA: Diagnosis not present

## 2022-03-16 DIAGNOSIS — N3000 Acute cystitis without hematuria: Secondary | ICD-10-CM | POA: Diagnosis not present

## 2022-03-16 LAB — DIFFERENTIAL
Abs Immature Granulocytes: 0.06 10*3/uL (ref 0.00–0.07)
Basophils Absolute: 0 10*3/uL (ref 0.0–0.1)
Basophils Relative: 0 %
Eosinophils Absolute: 0.1 10*3/uL (ref 0.0–0.5)
Eosinophils Relative: 1 %
Immature Granulocytes: 1 %
Lymphocytes Relative: 20 %
Lymphs Abs: 2.2 10*3/uL (ref 0.7–4.0)
Monocytes Absolute: 0.4 10*3/uL (ref 0.1–1.0)
Monocytes Relative: 4 %
Neutro Abs: 8 10*3/uL — ABNORMAL HIGH (ref 1.7–7.7)
Neutrophils Relative %: 74 %

## 2022-03-16 LAB — TSH: TSH: 5.048 u[IU]/mL — ABNORMAL HIGH (ref 0.350–4.500)

## 2022-03-16 LAB — URINE DRUG SCREEN, QUALITATIVE (ARMC ONLY)
Amphetamines, Ur Screen: NOT DETECTED
Barbiturates, Ur Screen: NOT DETECTED
Benzodiazepine, Ur Scrn: NOT DETECTED
Cannabinoid 50 Ng, Ur ~~LOC~~: NOT DETECTED
Cocaine Metabolite,Ur ~~LOC~~: NOT DETECTED
MDMA (Ecstasy)Ur Screen: NOT DETECTED
Methadone Scn, Ur: NOT DETECTED
Opiate, Ur Screen: NOT DETECTED
Phencyclidine (PCP) Ur S: NOT DETECTED
Tricyclic, Ur Screen: NOT DETECTED

## 2022-03-16 LAB — CBC
HCT: 51.6 % (ref 39.0–52.0)
Hemoglobin: 18 g/dL — ABNORMAL HIGH (ref 13.0–17.0)
MCH: 31 pg (ref 26.0–34.0)
MCHC: 34.9 g/dL (ref 30.0–36.0)
MCV: 88.8 fL (ref 80.0–100.0)
Platelets: 170 10*3/uL (ref 150–400)
RBC: 5.81 MIL/uL (ref 4.22–5.81)
RDW: 12.6 % (ref 11.5–15.5)
WBC: 10.8 10*3/uL — ABNORMAL HIGH (ref 4.0–10.5)
nRBC: 0 % (ref 0.0–0.2)

## 2022-03-16 LAB — URINALYSIS, ROUTINE W REFLEX MICROSCOPIC
Bilirubin Urine: NEGATIVE
Glucose, UA: NEGATIVE mg/dL
Hgb urine dipstick: NEGATIVE
Ketones, ur: NEGATIVE mg/dL
Nitrite: NEGATIVE
Protein, ur: NEGATIVE mg/dL
Specific Gravity, Urine: >1.046 — ABNORMAL HIGH (ref 1.005–1.030)
Squamous Epithelial / HPF: NONE SEEN (ref 0–5)
WBC, UA: >50 WBC/hpf — ABNORMAL HIGH (ref 0–5)
pH: 6 (ref 5.0–8.0)

## 2022-03-16 LAB — PROTIME-INR
INR: 1 (ref 0.8–1.2)
Prothrombin Time: 13 seconds (ref 11.4–15.2)

## 2022-03-16 LAB — COMPREHENSIVE METABOLIC PANEL
ALT: 35 U/L (ref 0–44)
AST: 30 U/L (ref 15–41)
Albumin: 4.6 g/dL (ref 3.5–5.0)
Alkaline Phosphatase: 50 U/L (ref 38–126)
Anion gap: 13 (ref 5–15)
BUN: 21 mg/dL (ref 8–23)
CO2: 23 mmol/L (ref 22–32)
Calcium: 10 mg/dL (ref 8.9–10.3)
Chloride: 100 mmol/L (ref 98–111)
Creatinine, Ser: 0.97 mg/dL (ref 0.61–1.24)
GFR, Estimated: >60 mL/min (ref >60)
Glucose, Bld: 188 mg/dL — ABNORMAL HIGH (ref 70–99)
Potassium: 3.6 mmol/L (ref 3.5–5.1)
Sodium: 136 mmol/L (ref 135–145)
Total Bilirubin: 1.4 mg/dL — ABNORMAL HIGH (ref 0.3–1.2)
Total Protein: 7.3 g/dL (ref 6.5–8.1)

## 2022-03-16 LAB — APTT: aPTT: 24 seconds (ref 24–36)

## 2022-03-16 LAB — CBG MONITORING, ED: Glucose-Capillary: 178 mg/dL — ABNORMAL HIGH (ref 70–99)

## 2022-03-16 IMAGING — CT CT ABD-PELV W/ CM
2 of 5 series · 16 of 46 positions shown, 18 images · IV contrast (APPLIED)
Comparison: Noncontrast CT on [DATE]

CLINICAL DATA: Acute abdominal pain.  Altered mental status.

EXAM:
CT ABDOMEN AND PELVIS WITH CONTRAST
TECHNIQUE: Multidetector CT imaging of the abdomen and pelvis was performed
using the standard protocol following bolus administration of
intravenous contrast.

[Series 2: abdomen 5.0 · axial · 0.75mm/px · z∈[-1052,-587]mm · 13 of 105 slices shown, 15 images]
[im 7/105  soft-tissue]
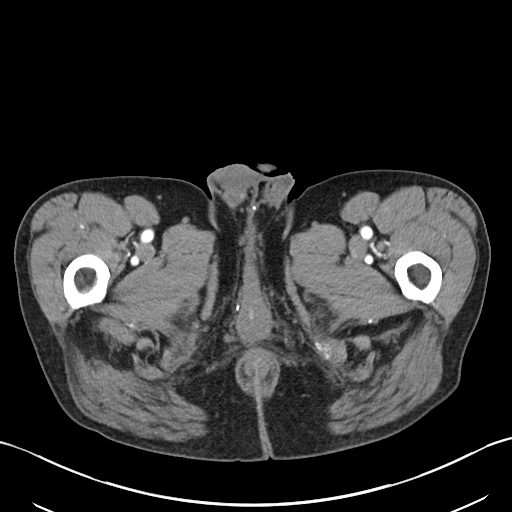
[im 7/105  bone]
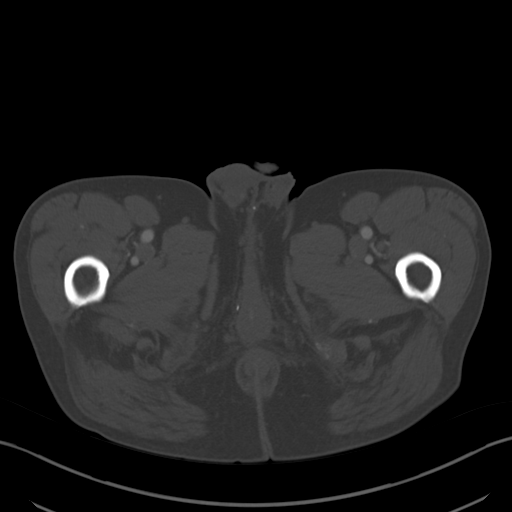
[im 14/105  soft-tissue]
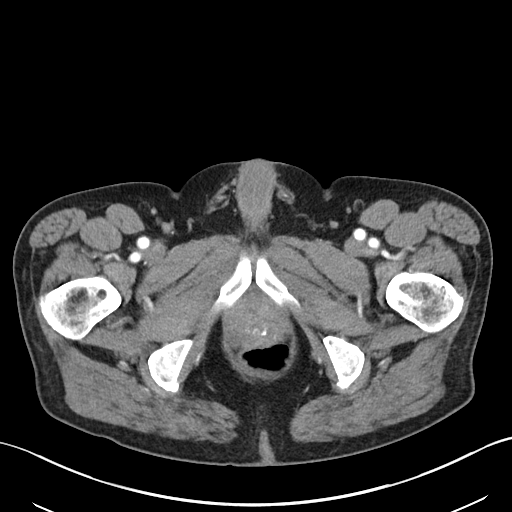
[im 21/105  soft-tissue]
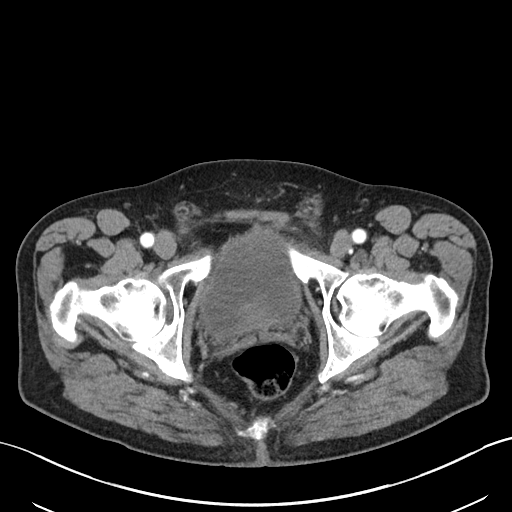
[im 28/105  soft-tissue]
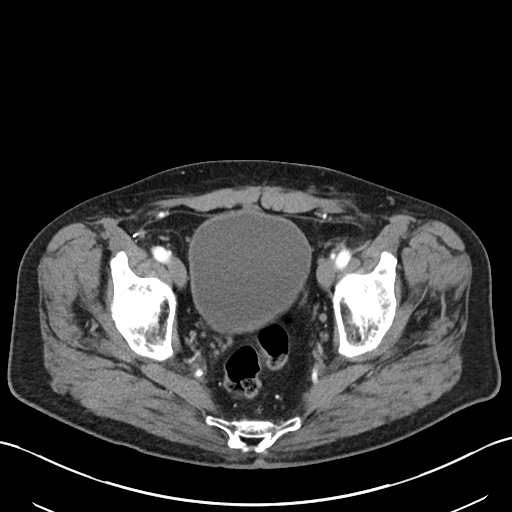
[im 35/105  soft-tissue]
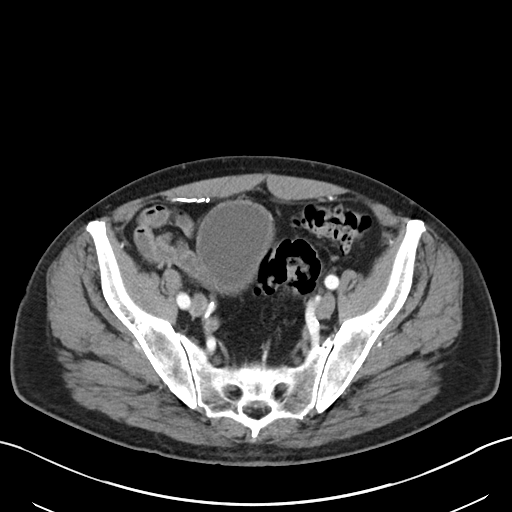
[im 42/105  soft-tissue]
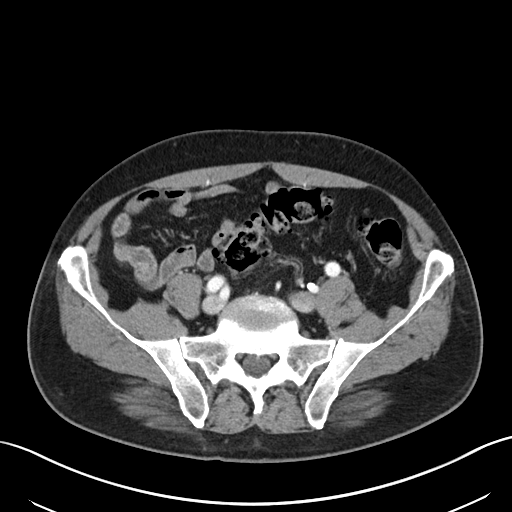
[im 56/105  soft-tissue]
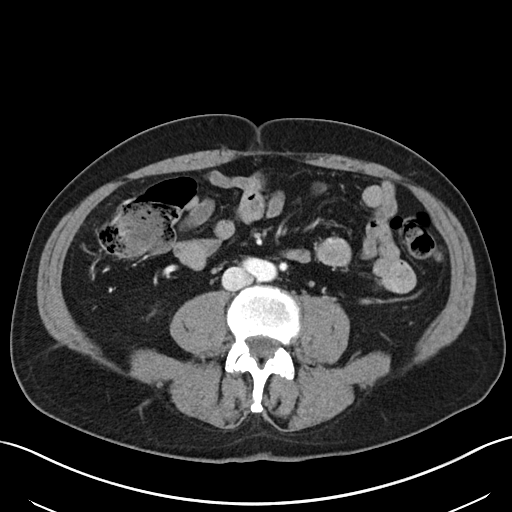
[im 63/105  soft-tissue]
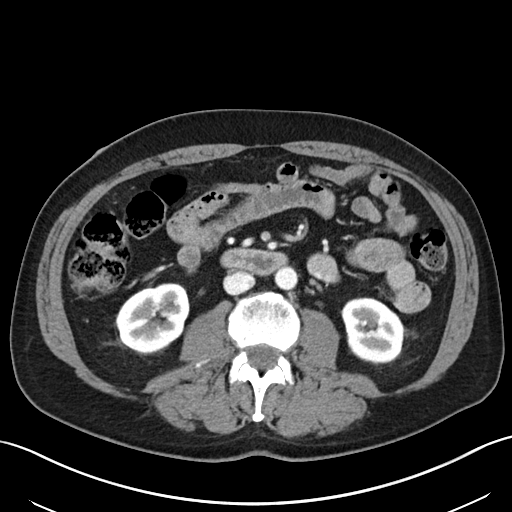
[im 70/105  soft-tissue]
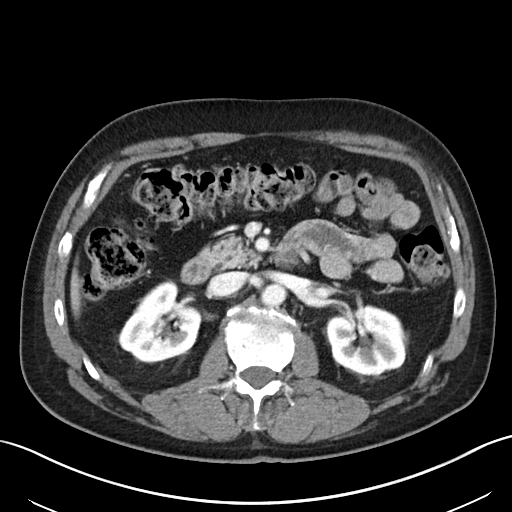
[im 70/105  bone]
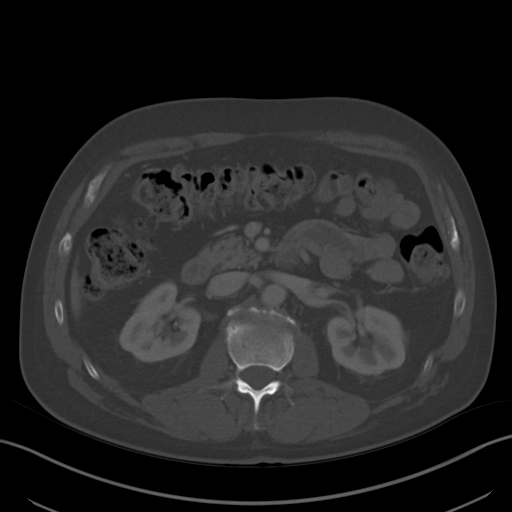
[im 77/105  soft-tissue]
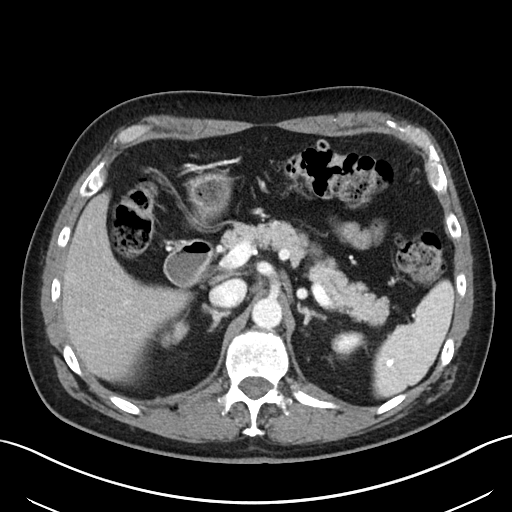
[im 84/105  soft-tissue]
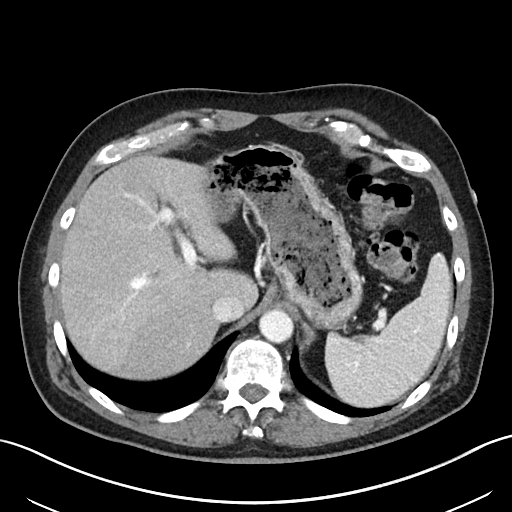
[im 91/105  soft-tissue]
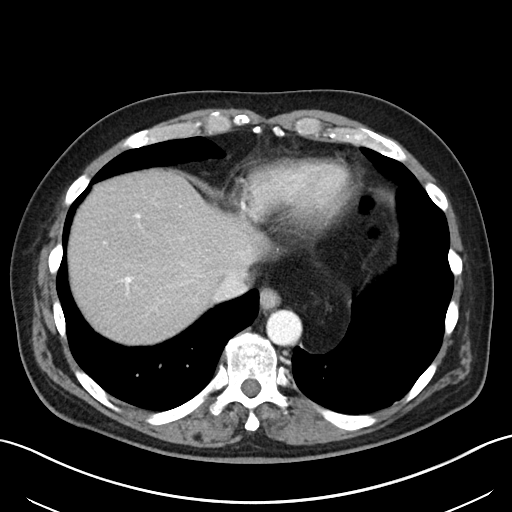
[im 98/105  soft-tissue]
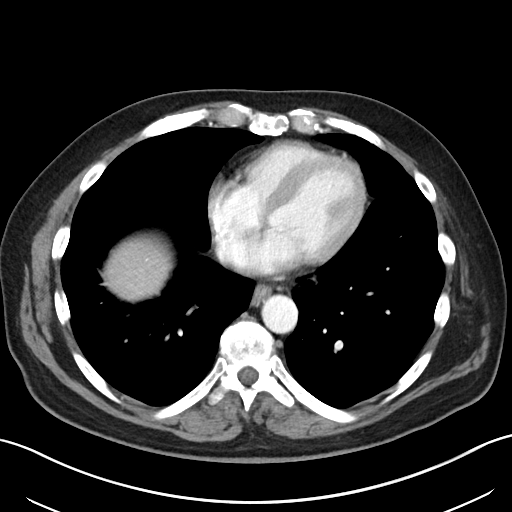

[Series 4: abdomen 3.0 mpr cor · coronal · 0.78mm/px · 3 of 98 slices shown]
[im 33/98  soft-tissue]
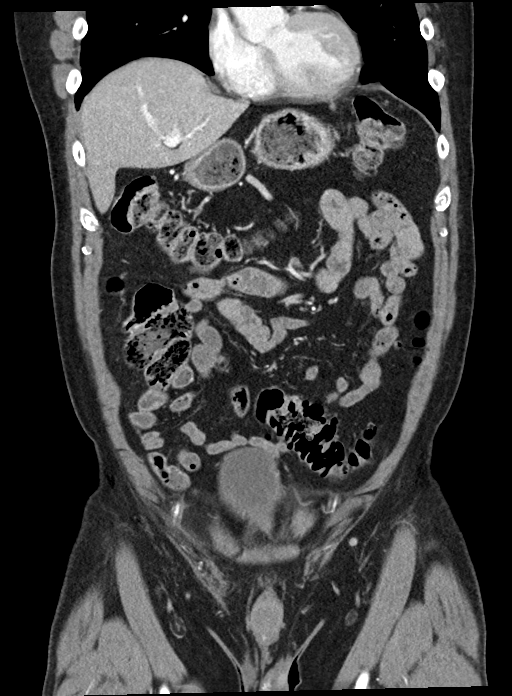
[im 44/98  soft-tissue]
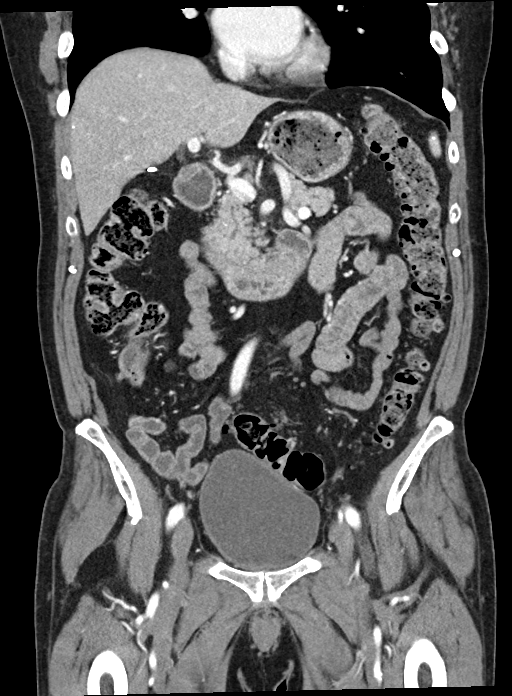
[im 54/98  soft-tissue]
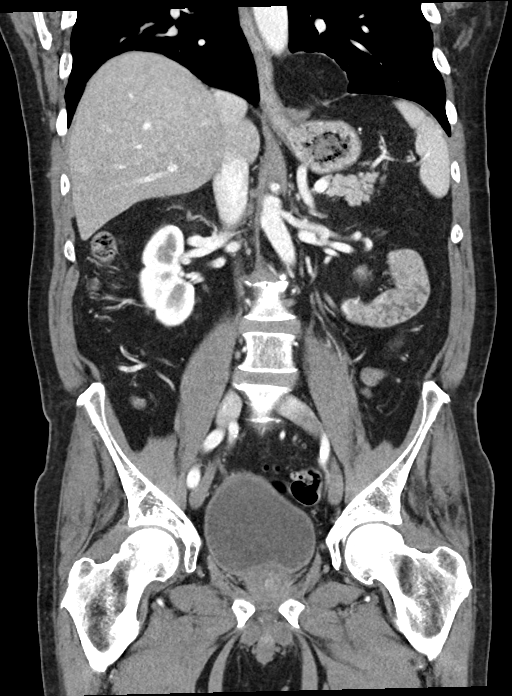

[16 of 46 positions shown; findings below may reference images not displayed]

RADIATION DOSE REDUCTION: This exam was performed according to the
departmental dose-optimization program which includes automated
exposure control, adjustment of the mA and/or kV according to
patient size and/or use of iterative reconstruction technique.

CONTRAST:  100mL OMNIPAQUE IOHEXOL 300 MG/ML  SOLN
FINDINGS: Lower Chest: No acute findings. Stable left-sided Bochdalek's hernia
which contains only fat.

Hepatobiliary: No hepatic masses identified. Prior cholecystectomy.
No evidence of biliary obstruction.

Pancreas:  No mass or inflammatory changes.

Spleen: Within normal limits in size and appearance.

Adrenals/Urinary Tract: No masses identified. No evidence of
ureteral calculi or hydronephrosis.

Stomach/Bowel: No evidence of obstruction, inflammatory process or
abnormal fluid collections. Diffuse colonic diverticulosis noted,
however there is no evidence of diverticulitis.

Vascular/Lymphatic: No pathologically enlarged lymph nodes. No acute
vascular findings.

Reproductive:  Mildly enlarged prostate.

Other:  None.

Musculoskeletal:  No suspicious bone lesions identified.
IMPRESSION: Colonic diverticulosis, without radiographic evidence of
diverticulitis or other acute findings.

Mildly enlarged prostate.

## 2022-03-16 IMAGING — CT CT HEAD CODE STROKE
4 series · 16 of 47 positions shown, 18 images · non-contrast
Comparison: None Available.

CLINICAL DATA: Code stroke.  Neuro deficit, acute, stroke suspected



[Series 3: head bone · axial · 0.44mm/px · z∈[-74,-42]mm · 3 of 79 slices shown]
[im 8/79  bone]
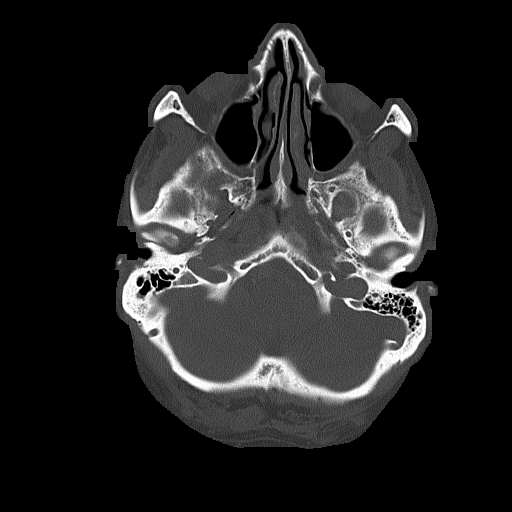
[im 16/79  bone]
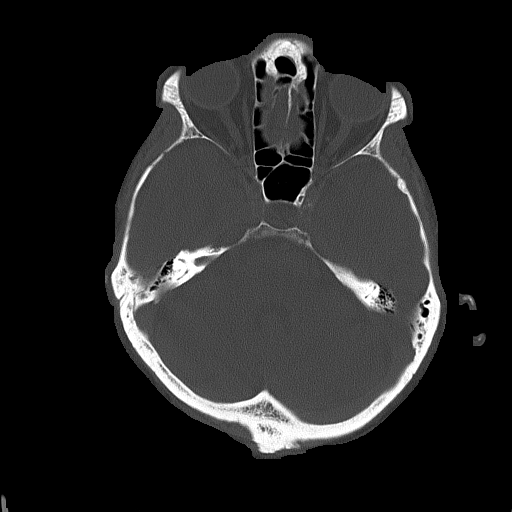
[im 24/79  bone]
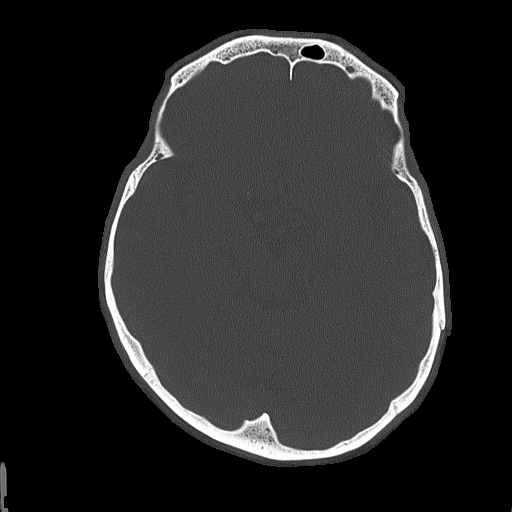

[Series 4: head wo · axial · 0.44mm/px · z∈[-73,+47]mm · 7 of 32 slices shown, 9 images]
[im 4/32  brain]
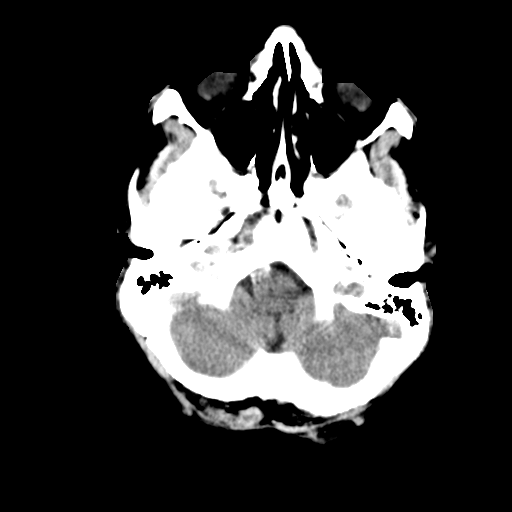
[im 4/32  bone]
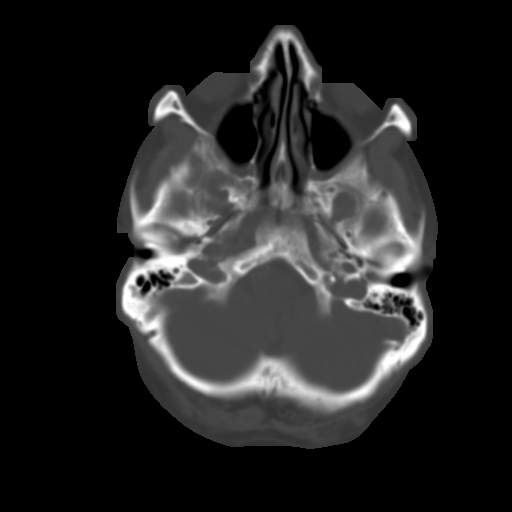
[im 8/32  brain]
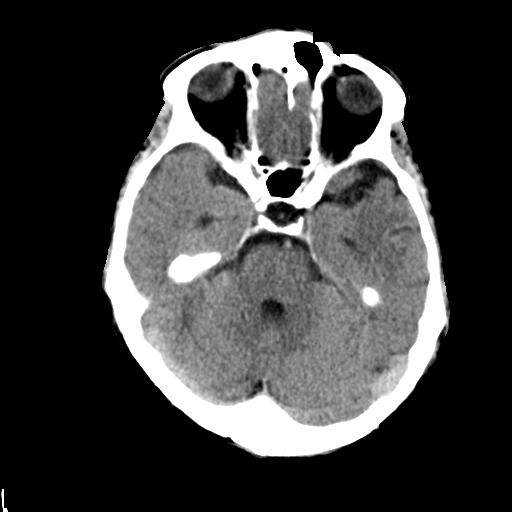
[im 12/32  brain]
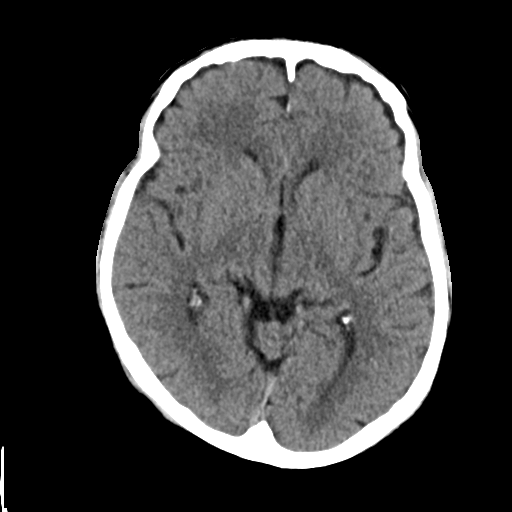
[im 16/32  brain]
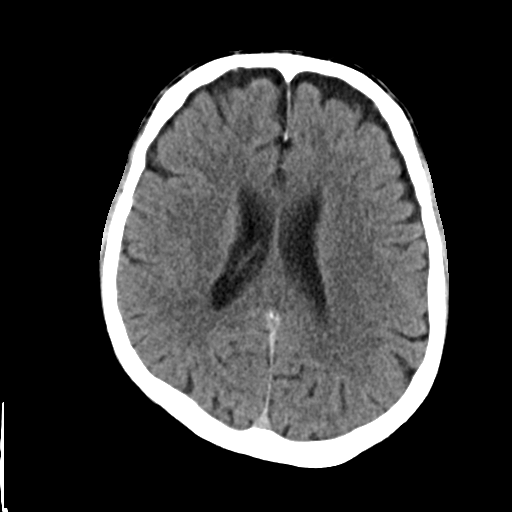
[im 20/32  brain]
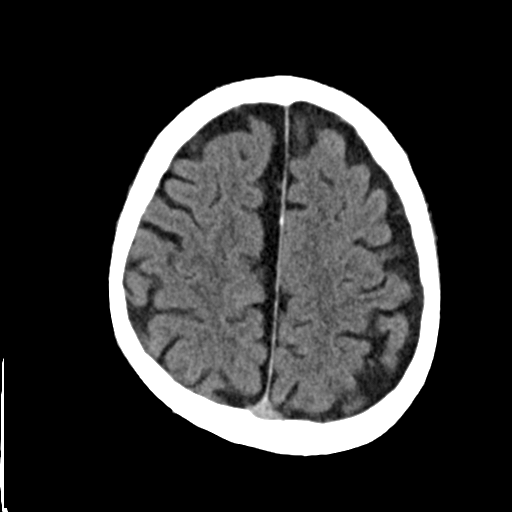
[im 20/32  bone]
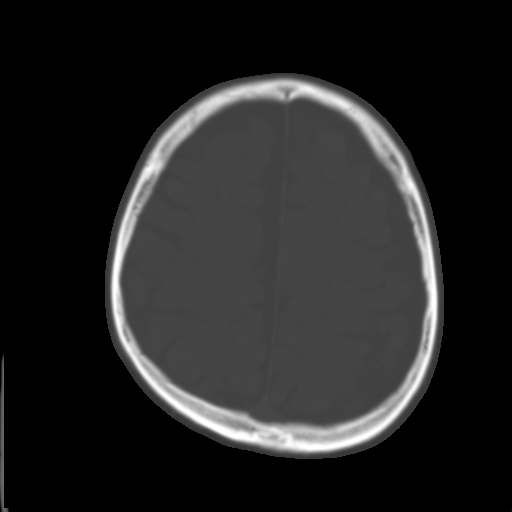
[im 24/32  brain]
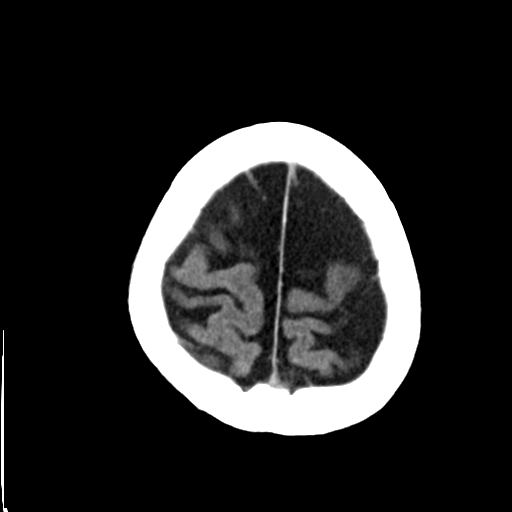
[im 28/32  brain]
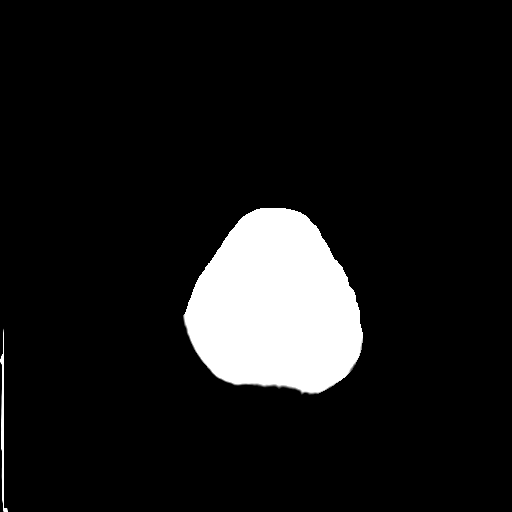

[Series 5: coronal soft tissue · coronal · 0.29mm/px · 3 of 67 slices shown]
[im 23/67  brain]
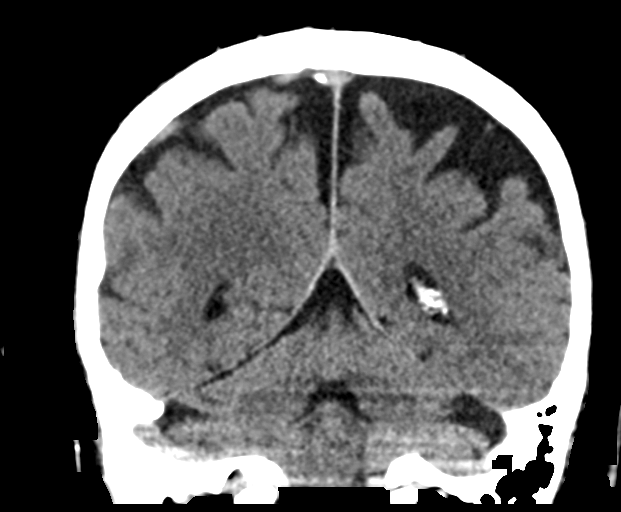
[im 30/67  brain]
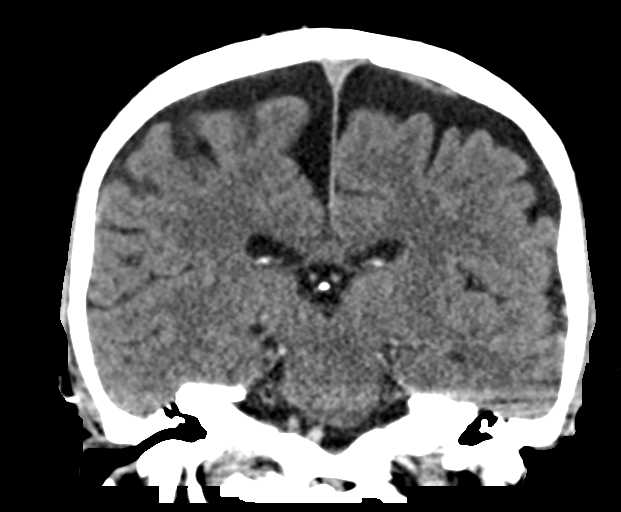
[im 37/67  brain]
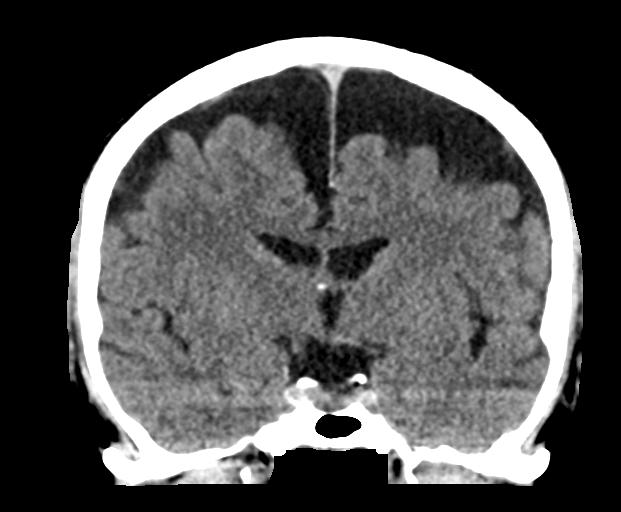

[Series 6: sagittal soft tissue · sagittal · 0.29mm/px · 3 of 58 slices shown]
[im 20/58  brain]
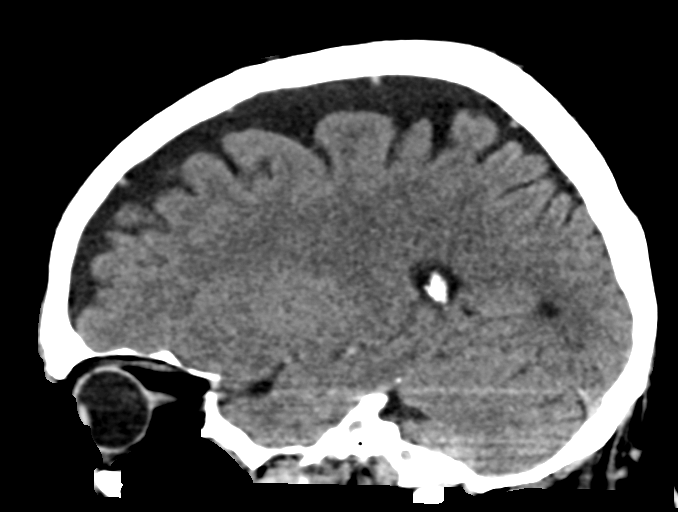
[im 29/58  brain]
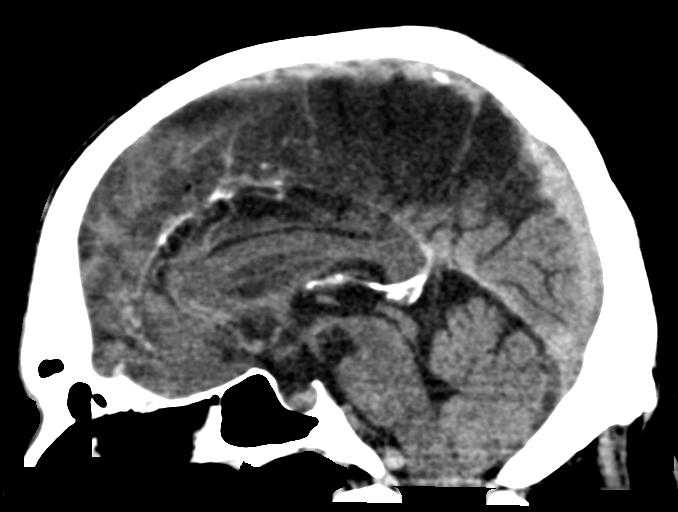
[im 39/58  brain]
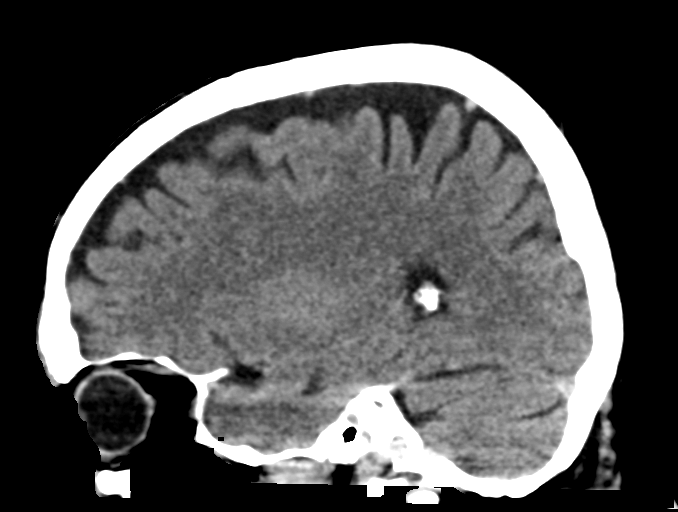

[16 of 47 positions shown; findings below may reference images not displayed]

FINDINGS: Brain: No evidence of acute large vascular territory infarction,
hemorrhage, hydrocephalus, extra-axial collection or mass
lesion/mass effect. Prominence of the extra-axial spaces along the
frontal convexities.

Vascular: No hyperdense vessel identified. Calcific intracranial
atherosclerosis

Skull: No acute fracture.

Sinuses/Orbits: Clear visualized sinuses. No acute orbital findings.

Other: Trace right mastoid effusion

ASPECTS ([place] Stroke Program Early CT Score) total score (0-10
with 10 being normal): 10.
IMPRESSION: No evidence of acute intracranial abnormality. ASPECTS is 10.

Code stroke imaging results were communicated on [DATE] at [DATE] to provider Dr. JIHYE via secure text paging.

## 2022-03-16 IMAGING — MR MR HEAD W/O CM
12 series · 47 of 48 positions shown · non-contrast
Comparison: Noncontrast head CT [DATE].

CLINICAL DATA: Provided history: Memory loss.

EXAM:
MRI HEAD WITHOUT CONTRAST
TECHNIQUE: Multiplanar, multiecho pulse sequences of the brain and surrounding
structures were obtained without intravenous contrast.

[Series 5: ax dwi_tracew · axial · 3.0mm · 0.65mm/px · z∈[-94,+60]mm · 3 of 48 slices shown]
[im 1/48]
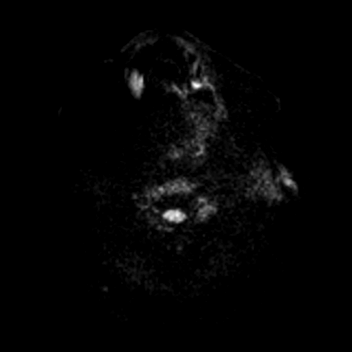
[im 24/48]
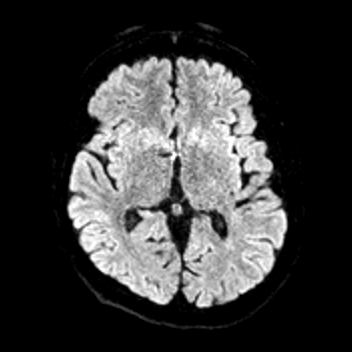
[im 48/48]
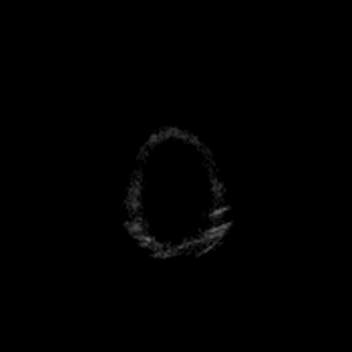

[Series 6: ax dwi_adc · axial · 3.0mm · 0.65mm/px · z∈[-94,+60]mm · 4 of 48 slices shown]
[im 1/48]
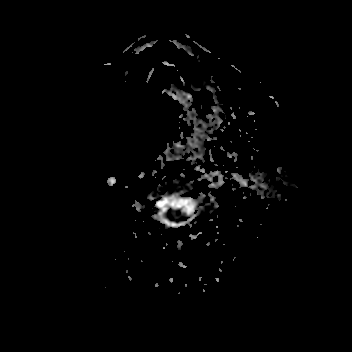
[im 16/48]
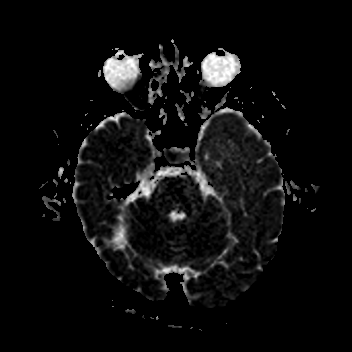
[im 32/48]
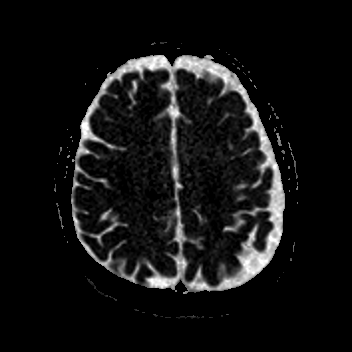
[im 48/48]
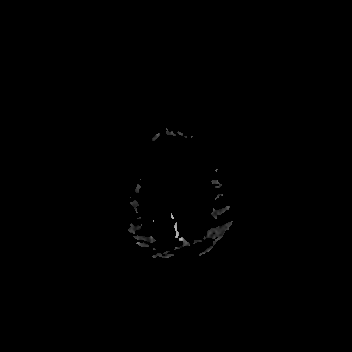

[Series 7: cor dwi_tracew · coronal · 5.0mm · 0.65mm/px · 3 of 38 slices shown]
[im 1/38]
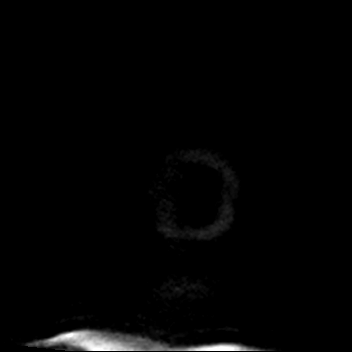
[im 19/38]
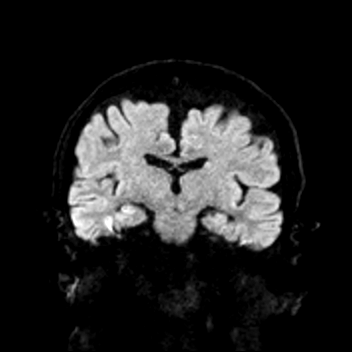
[im 38/38]
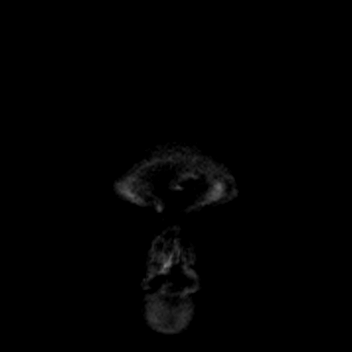

[Series 8: cor dwi_adc · coronal · 5.0mm · 0.65mm/px · 3 of 36 slices shown]
[im 1/36]
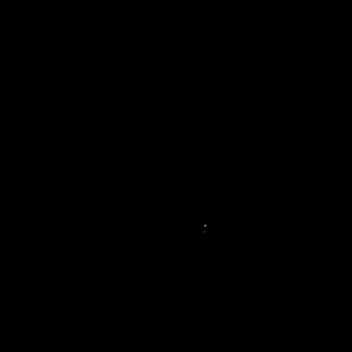
[im 18/36]
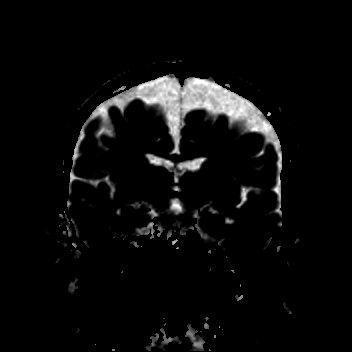
[im 36/36]
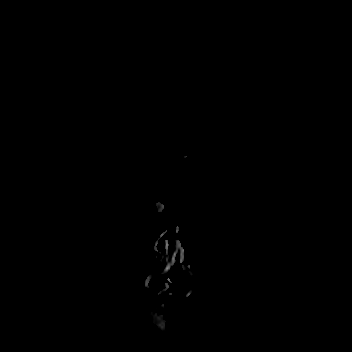

[Series 9: T1 · sagittal · 5.0mm · 0.62mm/px · 2 of 25 slices shown (1 of 2)]
[im 1/25]
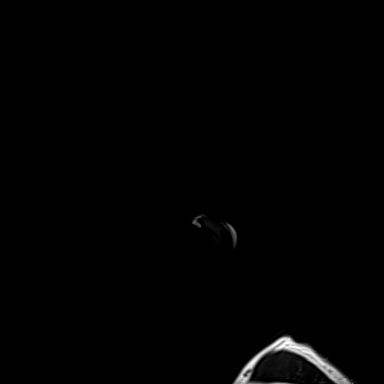
[im 25/25]
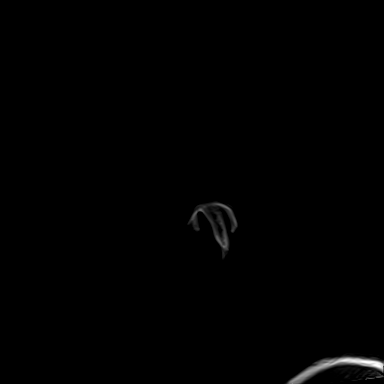

[Series 10: T2 · axial · 5.0mm · 0.53mm/px · z∈[-93,+56]mm · 2 of 26 slices shown (1 of 2)]
[im 1/26]
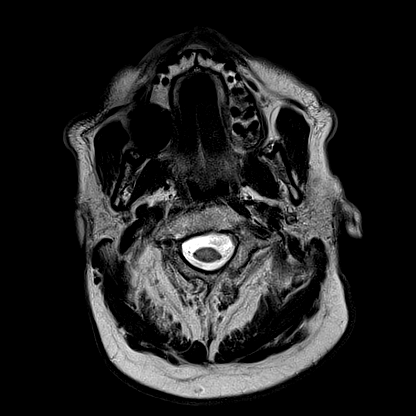
[im 26/26]
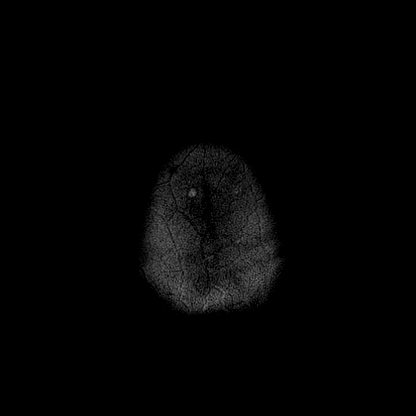

[Series 11: mag_images · axial · 3.0mm · 0.90mm/px · z∈[-106,+70]mm · 4 of 60 slices shown]
[im 1/60]
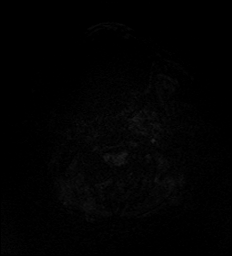
[im 20/60]
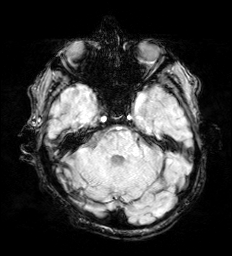
[im 40/60]
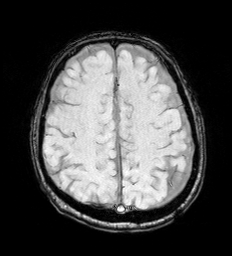
[im 60/60]
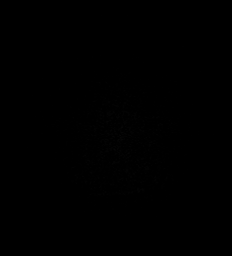

[Series 12: pha_images · axial · 3.0mm · 0.90mm/px · z∈[-106,+61]mm · 4 of 57 slices shown]
[im 1/57]
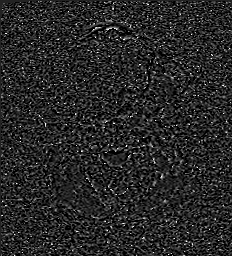
[im 19/57]
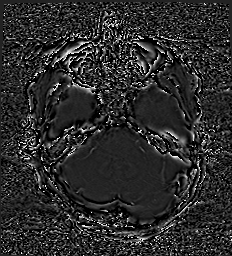
[im 38/57]
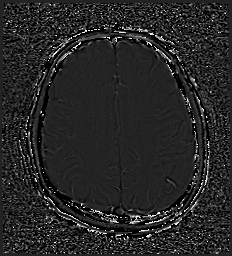
[im 57/57]
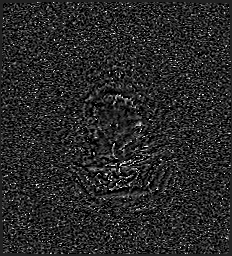

[Series 13: swi_images · axial · 3.0mm · 0.90mm/px · z∈[-106,+70]mm · 4 of 60 slices shown]
[im 1/60]
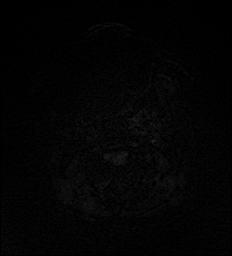
[im 20/60]
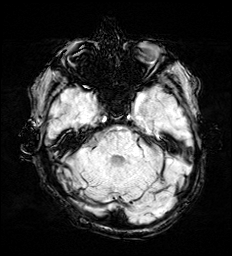
[im 40/60]
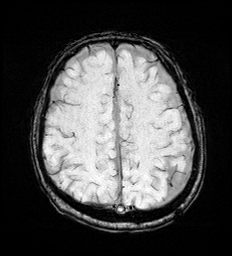
[im 60/60]
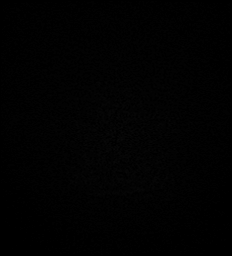

[Series 15: FLAIR · axial · 3.0mm · 0.53mm/px · z∈[-98,+62]mm · 4 of 55 slices shown]
[im 1/55]
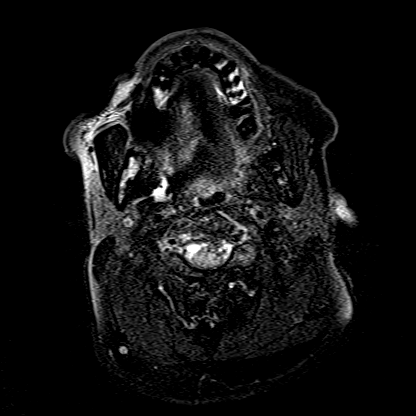
[im 19/55]
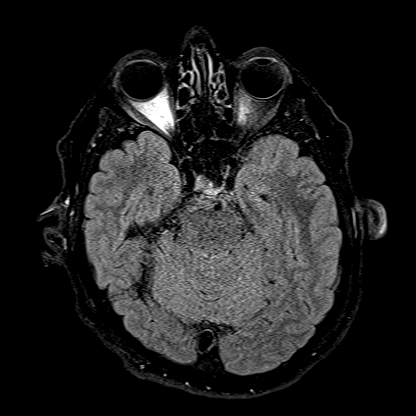
[im 37/55]
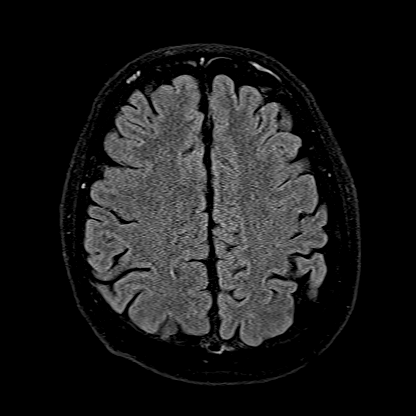
[im 55/55]
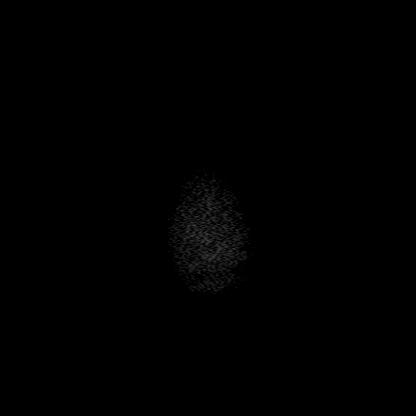

[Series 16: T1 · axial · 1.0mm · 0.98mm/px · z∈[-103,+71]mm · 12 of 176 slices shown (2 of 2)]
[im 1/176]
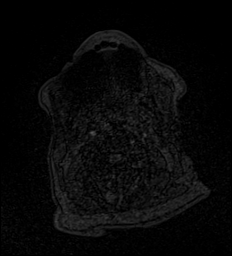
[im 15/176]
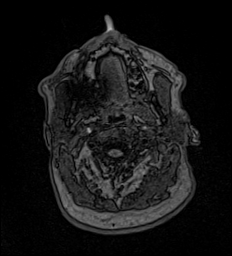
[im 30/176]
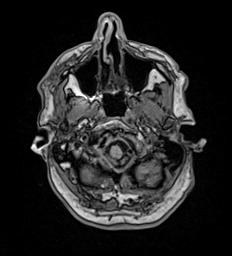
[im 44/176]
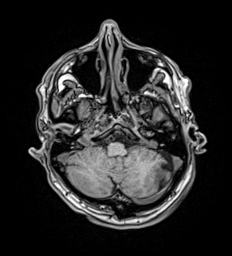
[im 59/176]
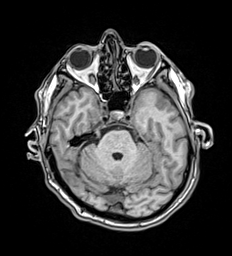
[im 73/176]
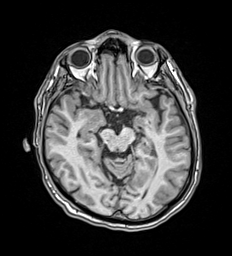
[im 88/176]
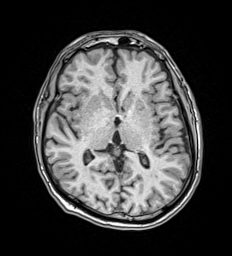
[im 103/176]
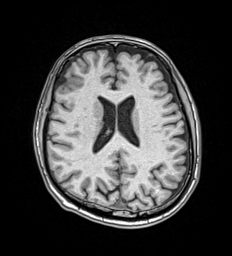
[im 117/176]
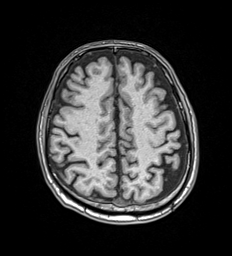
[im 132/176]
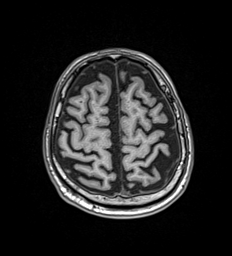
[im 146/176]
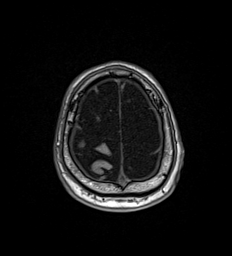
[im 176/176]
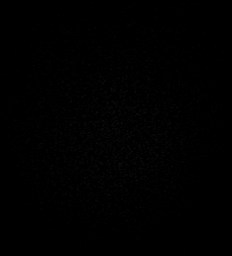

[Series 17: T2 · coronal · 5.0mm · 0.57mm/px · 2 of 29 slices shown (2 of 2)]
[im 1/29]
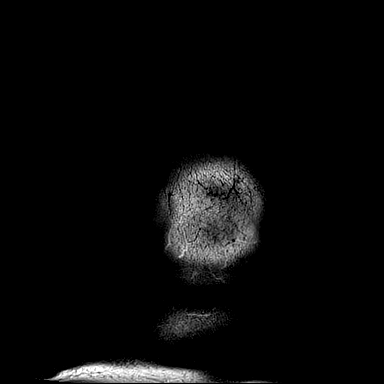
[im 29/29]
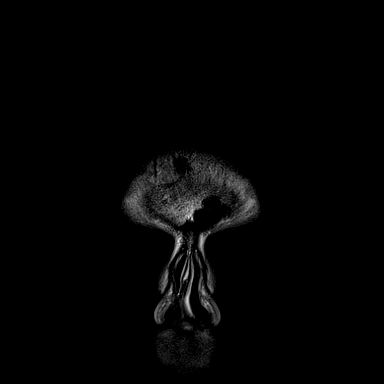

[47 of 48 positions shown; findings below may reference images not displayed]

FINDINGS: Brain:

No age-advanced or lobar predominant parenchymal atrophy.

A few small scattered foci of T2 FLAIR hyperintense signal
abnormality within the cerebral white matter are nonspecific, but
likely reflect minimal changes of chronic small vessel ischemia.

No cortical encephalomalacia is identified.

There is no acute infarct.

No evidence of an intracranial mass.

No extra-axial fluid collection.

No midline shift.

Vascular: Maintained flow voids within the proximal large arterial
vessels.

Skull and upper cervical spine: No focal suspicious marrow lesion.
Incompletely assessed cervical spondylosis.

Sinuses/Orbits: No mass or acute finding within the imaged orbits.
Minimal mucosal thickening within the bilateral ethmoid sinuses.
Frothy secretions within the left sphenoid sinus.

Other: Trace fluid within the right mastoid air cells.
IMPRESSION: No evidence of acute intracranial abnormality.

Minimal chronic small vessel ischemic changes within the cerebral
white matter.

No age-advanced or lobar predominant parenchymal atrophy.

Paranasal sinus disease, as described.

Trace fluid within the right mastoid air cells.

## 2022-03-16 MED ORDER — ACETAMINOPHEN 650 MG RE SUPP: 650.0000 mg | Freq: Four times a day (QID) | RECTAL | Status: DC | PRN

## 2022-03-16 MED ORDER — SODIUM CHLORIDE 0.9% FLUSH
3.0000 mL | Freq: Once | INTRAVENOUS | Status: AC
  Administered 2022-03-16: 3 mL via INTRAVENOUS

## 2022-03-16 MED ORDER — CEFTRIAXONE SODIUM 1 G IJ SOLR
1.0000 g | INTRAMUSCULAR | Status: DC
  Administered 2022-03-16: 1 g via INTRAVENOUS
  Filled 2022-03-16 (×2): qty 10

## 2022-03-16 MED ORDER — HEPARIN SODIUM (PORCINE) 5000 UNIT/ML IJ SOLN
5000.0000 [IU] | Freq: Two times a day (BID) | INTRAMUSCULAR | Status: DC
  Administered 2022-03-17: 5000 [IU] via SUBCUTANEOUS
  Filled 2022-03-16: qty 1

## 2022-03-16 MED ORDER — CEFTRIAXONE SODIUM 1 G IJ SOLR: 1.0000 g | Freq: Once | INTRAMUSCULAR | Status: DC

## 2022-03-16 MED ORDER — ACETAMINOPHEN 325 MG PO TABS: 650.0000 mg | ORAL_TABLET | Freq: Four times a day (QID) | ORAL | Status: DC | PRN

## 2022-03-16 MED ORDER — LACTATED RINGERS IV BOLUS
1000.0000 mL | Freq: Once | INTRAVENOUS | Status: AC
  Administered 2022-03-16: 1000 mL via INTRAVENOUS

## 2022-03-16 MED ORDER — SODIUM CHLORIDE 0.9 % IV SOLN
INTRAVENOUS | Status: DC
Start: 2022-03-16 — End: 2022-03-17

## 2022-03-16 MED ORDER — IOHEXOL 300 MG/ML  SOLN
100.0000 mL | Freq: Once | INTRAMUSCULAR | Status: AC | PRN
  Administered 2022-03-16: 100 mL via INTRAVENOUS

## 2022-03-16 MED ORDER — SODIUM CHLORIDE 0.9% FLUSH
3.0000 mL | Freq: Two times a day (BID) | INTRAVENOUS | Status: DC
  Administered 2022-03-16: 3 mL via INTRAVENOUS

## 2022-03-16 MED ORDER — ROSUVASTATIN CALCIUM 20 MG PO TABS
20.0000 mg | ORAL_TABLET | Freq: Every day | ORAL | Status: DC
Start: 2022-03-17 — End: 2022-03-17

## 2022-03-16 MED ORDER — PANTOPRAZOLE SODIUM 40 MG IV SOLR
40.0000 mg | Freq: Two times a day (BID) | INTRAVENOUS | Status: DC
  Administered 2022-03-16 – 2022-03-17 (×2): 40 mg via INTRAVENOUS
  Filled 2022-03-16 (×2): qty 10

## 2022-03-16 MED ORDER — HYDRALAZINE HCL 20 MG/ML IJ SOLN: 10.0000 mg | Freq: Four times a day (QID) | INTRAMUSCULAR | Status: DC | PRN

## 2022-03-16 NOTE — ED Provider Notes (Signed)
I assumed care of this patient approximately 1500.  Please see off going providers note for full details regarding patient's initial evaluation and assessment.  In brief patient presents initially reportedly for evaluation of abdominal discomfort but was severely confused in triage and made a code stroke.  His work-up is largely reassuring including MR brain that does not show an acute stroke.  He also had a CT of his abdomen that showed some diverticulosis but no other acute process.  Labs are grossly unremarkable.  Plan is to follow-up UA reassess and likely admit.  This UA is concerning for cystitis.  Urine culture sent and I will start patient on Rocephin.  I will admit to medicine service for further evaluation and management as per family I spoke with at bedside patient is way off baseline as far as memory abilities at this time. ?  ?Lucrezia Starch, MD ?03/16/22 1842 ? ?

## 2022-03-16 NOTE — H&P (Signed)
?History and Physical  ? ? ?Patient: Vincent Knapp LGX:[id number] DOB: [birth year] ?DOA: 03/16/2022 ?DOS: the patient was seen and examined on 03/16/2022 ?PCP: Kirk Ruths, MD  ?Patient coming from: Home ? ?Chief Complaint:  ?Chief Complaint  ?Patient presents with  ? Altered Mental Status  ? Code Stroke  ? ?HPI: Vincent Knapp is a 76 y.o. male with no significant past medical history  ?Presenting with symptoms of abdominal pain patient drove himself to the hospital.  While here patient repeatedly asked a series of questions in order again and again and repeat it the same questions again and again and would not remember that he had been spoken to or his questions were addressed.  Initial presentation code stroke was activated.  Patient does not recognize me after asking me where he is why he is here however my wears his wife what is going on with him and that he would ask her the same questions again and again and he has done this several and multiple times.  It seems that patient is having memory issues and has no history of dementia.  Upon arrival patient had a head CT which was negative and a neurology consult was called with recommendations for MRI. ?Blood work today shows that patient has UTI, hyperglycemia, elevated total bilirubin. ? ?Review of Systems  ?Unable to perform ROS: Dementia  ? ?Past Medical History:  ?Diagnosis Date  ? Actinic keratosis   ? ?Past Surgical History:  ?Procedure Laterality Date  ? CHOLECYSTECTOMY    ? ?Social History:  reports that he has never smoked. He has never used smokeless tobacco. He reports current alcohol use. He reports that he does not use drugs. ? ?Allergies  ?Allergen Reactions  ? Codeine Nausea Only  ? ? ?Family History  ?Problem Relation Age of Onset  ? Melanoma Brother   ? ? ?Prior to Admission medications   ?Medication Sig Start Date End Date Taking? Authorizing Provider  ?lisinopril (ZESTRIL) 20 MG tablet Take 20 mg by mouth at bedtime. 06/18/21  Yes [provider]  ?meloxicam (MOBIC) 15 MG tablet Take 15 mg by mouth daily. 03/11/22  Yes [provider]  ?methylPREDNISolone (MEDROL DOSEPAK) 4 MG TBPK tablet TAKE 6 TABLETS ON DAY 1 AS DIRECTED ON PACKAGE AND DECREASE BY 1 TAB EACH DAY FOR A TOTAL OF 6 DAYS 03/11/22  Yes [provider]  ?rosuvastatin (CRESTOR) 20 MG tablet Take 20 mg by mouth at bedtime. 02/26/22  Yes [provider]  ?doxylamine, Sleep, (UNISOM) 25 MG tablet Take 25 mg by mouth at bedtime.    [provider]  ?Multiple Vitamin (MULTIVITAMIN WITH MINERALS) TABS tablet Take 1 tablet by mouth daily.    [provider]  ? ? ?Physical Exam: ?Vitals:  ? 03/16/22 1930 03/16/22 2000 03/16/22 2111 03/16/22 2150  ?BP: (!) 158/67 (!) 147/73 (!) 154/76   ?Pulse: 62 (!) 55 63   ?Resp:  18 18   ?Temp:  98.6 ?F (37 ?C)    ?TempSrc:      ?SpO2: 99% 97% 98%   ?Weight:    83.5 kg  ?Height:    '5\' 10"'$  (1.778 m)  ? ?Physical Exam ?Vitals and nursing note reviewed.  ?Constitutional:   ?   General: He is not in acute distress. ?   Appearance: Normal appearance. He is not ill-appearing, toxic-appearing or diaphoretic.  ?HENT:  ?   Head: Normocephalic and atraumatic.  ?   Right Ear: Hearing and external ear normal.  ?  Left Ear: Hearing and external ear normal.  ?   Nose: Nose normal. No nasal deformity.  ?   Mouth/Throat:  ?   Lips: Pink.  ?   Mouth: Mucous membranes are moist.  ?   Tongue: No lesions.  ?   Pharynx: Oropharynx is clear.  ?Eyes:  ?   Extraocular Movements: Extraocular movements intact.  ?   Pupils: Pupils are equal, round, and reactive to light.  ?Neck:  ?   Vascular: No carotid bruit.  ?Cardiovascular:  ?   Rate and Rhythm: Normal rate and regular rhythm.  ?   Pulses: Normal pulses.  ?   Heart sounds: Normal heart sounds.  ?Pulmonary:  ?   Effort: Pulmonary effort is normal.  ?   Breath sounds: Normal breath sounds.  ?Abdominal:  ?   General: Bowel sounds are normal. There is no distension.  ?   Palpations:  Abdomen is soft. There is no mass.  ?   Tenderness: There is no abdominal tenderness. There is no guarding.  ?   Hernia: No hernia is present.  ?Musculoskeletal:  ?   Right lower leg: No edema.  ?   Left lower leg: No edema.  ?Skin: ?   General: Skin is warm.  ?Neurological:  ?   General: No focal deficit present.  ?   Mental Status: He is alert.  ?   Cranial Nerves: Cranial nerves 2-12 are intact.  ?   Motor: Motor function is intact.  ?Psychiatric:     ?   Attention and Perception: Attention normal.     ?   Mood and Affect: Mood normal.     ?   Speech: Speech normal.     ?   Behavior: Behavior normal. Behavior is cooperative.     ?   Cognition and Memory: Cognition normal.  ? ? ?Data Reviewed: ?Results for orders placed or performed during the hospital encounter of 03/16/22 (from the past 24 hour(s))  ?CBG monitoring, ED     Status: Abnormal  ? Collection Time: 03/16/22  2:13 PM  ?Result Value Ref Range  ? Glucose-Capillary 178 (H) 70 - 99 mg/dL  ?Protime-INR     Status: None  ? Collection Time: 03/16/22  2:23 PM  ?Result Value Ref Range  ? Prothrombin Time 13.0 11.4 - 15.2 seconds  ? INR 1.0 0.8 - 1.2  ?APTT     Status: None  ? Collection Time: 03/16/22  2:23 PM  ?Result Value Ref Range  ? aPTT 24 24 - 36 seconds  ?CBC     Status: Abnormal  ? Collection Time: 03/16/22  2:23 PM  ?Result Value Ref Range  ? WBC 10.8 (H) 4.0 - 10.5 K/uL  ? RBC 5.81 4.22 - 5.81 MIL/uL  ? Hemoglobin 18.0 (H) 13.0 - 17.0 g/dL  ? HCT 51.6 39.0 - 52.0 %  ? MCV 88.8 80.0 - 100.0 fL  ? MCH 31.0 26.0 - 34.0 pg  ? MCHC 34.9 30.0 - 36.0 g/dL  ? RDW 12.6 11.5 - 15.5 %  ? Platelets 170 150 - 400 K/uL  ? nRBC 0.0 0.0 - 0.2 %  ?Differential     Status: Abnormal  ? Collection Time: 03/16/22  2:23 PM  ?Result Value Ref Range  ? Neutrophils Relative % 74 %  ? Neutro Abs 8.0 (H) 1.7 - 7.7 K/uL  ? Lymphocytes Relative 20 %  ? Lymphs Abs 2.2 0.7 - 4.0 K/uL  ? Monocytes Relative 4 %  ?  Monocytes Absolute 0.4 0.1 - 1.0 K/uL  ? Eosinophils Relative 1 %  ?  Eosinophils Absolute 0.1 0.0 - 0.5 K/uL  ? Basophils Relative 0 %  ? Basophils Absolute 0.0 0.0 - 0.1 K/uL  ? Immature Granulocytes 1 %  ? Abs Immature Granulocytes 0.06 0.00 - 0.07 K/uL  ?Comprehensive metabolic panel     Status: Abnormal  ? Collection Time: 03/16/22  2:23 PM  ?Result Value Ref Range  ? Sodium 136 135 - 145 mmol/L  ? Potassium 3.6 3.5 - 5.1 mmol/L  ? Chloride 100 98 - 111 mmol/L  ? CO2 23 22 - 32 mmol/L  ? Glucose, Bld 188 (H) 70 - 99 mg/dL  ? BUN 21 8 - 23 mg/dL  ? Creatinine, Ser 0.97 0.61 - 1.24 mg/dL  ? Calcium 10.0 8.9 - 10.3 mg/dL  ? Total Protein 7.3 6.5 - 8.1 g/dL  ? Albumin 4.6 3.5 - 5.0 g/dL  ? AST 30 15 - 41 U/L  ? ALT 35 0 - 44 U/L  ? Alkaline Phosphatase 50 38 - 126 U/L  ? Total Bilirubin 1.4 (H) 0.3 - 1.2 mg/dL  ? GFR, Estimated >60 >60 mL/min  ? Anion gap 13 5 - 15  ?Urinalysis, Routine w reflex microscopic Urine, Clean Catch     Status: Abnormal  ? Collection Time: 03/16/22  5:51 PM  ?Result Value Ref Range  ? Color, Urine YELLOW (A) YELLOW  ? APPearance HAZY (A) CLEAR  ? Specific Gravity, Urine >1.046 (H) 1.005 - 1.030  ? pH 6.0 5.0 - 8.0  ? Glucose, UA NEGATIVE NEGATIVE mg/dL  ? Hgb urine dipstick NEGATIVE NEGATIVE  ? Bilirubin Urine NEGATIVE NEGATIVE  ? Ketones, ur NEGATIVE NEGATIVE mg/dL  ? Protein, ur NEGATIVE NEGATIVE mg/dL  ? Nitrite NEGATIVE NEGATIVE  ? Leukocytes,Ua LARGE (A) NEGATIVE  ? RBC / HPF 0-5 0 - 5 RBC/hpf  ? WBC, UA >50 (H) 0 - 5 WBC/hpf  ? Bacteria, UA RARE (A) NONE SEEN  ? Squamous Epithelial / LPF NONE SEEN 0 - 5  ?TSH     Status: Abnormal  ? Collection Time: 03/16/22  5:56 PM  ?Result Value Ref Range  ? TSH 5.048 (H) 0.350 - 4.500 uIU/mL  ?Urine Drug Screen, Qualitative ([facility] only)     Status: None  ? Collection Time: 03/16/22  6:15 PM  ?Result Value Ref Range  ? Tricyclic, Ur Screen NONE DETECTED NONE DETECTED  ? Amphetamines, Ur Screen NONE DETECTED NONE DETECTED  ? MDMA (Ecstasy)Ur Screen NONE DETECTED NONE DETECTED  ? Cocaine Metabolite,Ur [place] NONE DETECTED  NONE DETECTED  ? Opiate, Ur Screen NONE DETECTED NONE DETECTED  ? Phencyclidine (PCP) Ur S NONE DETECTED NONE DETECTED  ? Cannabinoid 50 Ng, Ur [place] NONE DETECTED NONE DETECTED  ? Barbiturates, Ur Screen

## 2022-03-16 NOTE — Plan of Care (Signed)
?  Problem: Education: ?Goal: Knowledge of General Education information will improve ?Description: Including pain rating scale, medication(s)/side effects and non-pharmacologic comfort measures ?Outcome: Progressing ?  ?Problem: Health Behavior/Discharge Planning: ?Goal: Ability to manage health-related needs will improve ?Outcome: Progressing ?  ?Problem: Clinical Measurements: ?Goal: Ability to maintain clinical measurements within normal limits will improve ?Outcome: Progressing ?Goal: Will remain free from infection ?Outcome: Progressing ?Goal: Diagnostic test results will improve ?Outcome: Progressing ?Goal: Respiratory complications will improve ?Outcome: Progressing ?Goal: Cardiovascular complication will be avoided ?Outcome: Progressing ?  ?Problem: Activity: ?Goal: Risk for activity intolerance will decrease ?Outcome: Progressing ?  ?Problem: Nutrition: ?Goal: Adequate nutrition will be maintained ?Outcome: Progressing ?  ?Problem: Coping: ?Goal: Level of anxiety will decrease ?Outcome: Progressing ?  ?Problem: Elimination: ?Goal: Will not experience complications related to bowel motility ?Outcome: Progressing ?Goal: Will not experience complications related to urinary retention ?Outcome: Progressing ?  ?Problem: Pain Managment: ?Goal: General experience of comfort will improve ?Outcome: Progressing ?  ?Problem: Safety: ?Goal: Ability to remain free from injury will improve ?Outcome: Progressing ?  ?Problem: Skin Integrity: ?Goal: Risk for impaired skin integrity will decrease ?Outcome: Progressing ? Patient confused. Oriented to person and place only. Patient frequently requesting his phone, dialed his wife and had her explain to him why he does not have his phone and that he needed to stay in the hospital overnight. Patient repeats the same question within one minute of asking.  ?

## 2022-03-16 NOTE — Assessment & Plan Note (Addendum)
Continue on statin °

## 2022-03-16 NOTE — ED Notes (Signed)
Pt stating he does not remember anything from earlier this afternoon. Pt partially remembers the events from this morning with family, confirmed with family at bedside. ?

## 2022-03-16 NOTE — ED Notes (Signed)
CODE STROKE called to secretary Lattie Haw at this time. ?

## 2022-03-16 NOTE — ED Notes (Signed)
Pt up to desk and asked did he check in or not. Myah informed pt that she had him on the board already.  ?

## 2022-03-16 NOTE — ED Notes (Signed)
PT assisted to toilet. Pt family concerned that patient is having difficulty remembering the events of today. Pt family states that pt has been having issues with memory off and on for years but nothing like today. MD Z smith at bedside. ?

## 2022-03-16 NOTE — ED Provider Notes (Signed)
? ?[facility] ?Provider Note ? ? ? Event Date/Time  ? First MD Initiated Contact with Patient 03/16/22 1435   ?  (approximate) ? ?History  ? ?Chief Complaint: Altered Mental Status and Code Stroke ? ?HPI ? ?Vincent Knapp is a 76 y.o. male presents to the emergency department initially for abdominal pain however became confused in the waiting room.  Per report patient came initially for abdominal pain however in the waiting room patient continued to go up to the desk and ask where he was and why he is there.  Patient brought back emergently to the emergency department for evaluation as a code stroke, seen by neurology in tandem with myself.  Here the patient continuously asked why he is here, does not recall answers he was given several seconds ago and let alone minutes ago.  Patient does not recall why he is here, does not recall having abdominal pain. ? ?Physical Exam  ? ?Triage Vital Signs: ?ED Triage Vitals [03/16/22 1414]  ?Enc Vitals Group  ?   BP 122/76  ?   Pulse Rate 68  ?   Resp 18  ?   Temp   ?   Temp src   ?   SpO2 100 %  ?   Weight   ?   Height   ?   Head Circumference   ?   Peak Flow   ?   Pain Score   ?   Pain Loc   ?   Pain Edu?   ?   Excl. in [place]?   ? ? ?Most recent vital signs: ?Vitals:  ? 03/16/22 1414 03/16/22 1435  ?BP: 122/76 (!) 196/86  ?Pulse: 68 64  ?Resp: 18 17  ?SpO2: 100% 99%  ? ? ?General: Awake, no distress.  ?CV:  Good peripheral perfusion.  Regular rate and rhythm  ?Resp:  Normal effort.  Equal breath sounds bilaterally.  ?Abd:  No distention.  Soft, nontender.  No rebound or guarding. ?Other:  Good range of motion in all extremities, 5/5 strength in all extremities.  No cranial nerve deficits. ? ? ?ED Results / Procedures / Treatments  ? ?EKG ? ?EKG viewed and interpreted by myself shows a normal sinus rhythm at 63 bpm with a narrow QRS, normal axis, normal intervals, no concerning ST changes. ? ?RADIOLOGY ? ?I personally reviewed the CT images of head, no large  bleed seen on my evaluation. ?Radiology is read the CT scan is negative. ? ? ?MEDICATIONS ORDERED IN ED: ?Medications  ?sodium chloride flush (NS) 0.9 % injection 3 mL (has no administration in time range)  ? ? ? ?IMPRESSION / MDM / ASSESSMENT AND PLAN / ED COURSE  ?I reviewed the triage vital signs and the nursing notes. ? ?Patient presents emergency department initially for abdominal pain however while waiting to be seen became acutely confused and brought back urgently as a code stroke.  Neurology has seen the patient in tandem with myself.  Patient is very forgetful does not recall why he is here did not recall what day it is or year it is does not recall answers that he was just given several seconds or minutes ago.  Per Dr. Leonel Ramsay exam most consistent with transient global amnesia.  We will still proceed with MR imaging.  We will continue to work-up in the emergency department.  Given the patient's initial complaint of abdominal pain although the patient does not recall this we will obtain CT imaging the abdomen/pelvis.  Patient denies any abdominal pain at this time however exam could be limited or misleading given his transient global amnesia.  We will check labs, CT scan abdomen and continue to closely monitor.  Depending on the patient's neurological status he very well may require admission to the hospital for ongoing work-up and management. ? ?Work-up thus far is reassuring.  CBC shows no concerning abnormalities, chemistry largely normal signs mild hyperglycemia.  CT scan of the head is negative.  Urinalysis is pending.  CT of the abdomen/pelvis is pending as well as MRI of the brain.  Patient care signed out to oncoming provider.  Patient is not a TNK candidate per neurology currently. ? ?FINAL CLINICAL IMPRESSION(S) / ED DIAGNOSES  ? ?Transient global amnesia ?Abdominal pain ? ? ? ?Note:  This document was prepared using Dragon voice recognition software and may include unintentional dictation  errors. ?  ?Harvest Dark, MD ?03/16/22 1459 ? ?

## 2022-03-16 NOTE — Progress Notes (Signed)
?   03/16/22 1500  ?Clinical Encounter Type  ?Visited With Patient  ?Visit Type Initial;Code  ?Spiritual Encounters  ?Spiritual Needs Prayer  ? ?Chaplain responded to Code Stroke. No family present. Patient confused and chaplain provided assurance through presence and conversation.  ?

## 2022-03-16 NOTE — Consult Note (Signed)
Neurology Consultation ?Reason for Consult: Memory difficulties ?Referring Physician: Dr. Kerman Passey ? ?CC: "I do not know why I am here" ? ?History is obtained from: Patient, nurses ? ?HPI: Vincent Knapp is a 76 y.o. male who drove himself to the emergency department today, after arriving he checked in and reported his complaint as abdominal pain.  Following being brought back to a room, he repeatedly asked the nursing staff why he was there, appearing confused as to how he got to the hospital.  Due to this a code stroke was activated.  On my arrival, the patient was awake, alert with a clear sensorium, but did not know how he had been brought to the emergency department, or even how he had been brought to the [place] (where I first met him).  Over the course of my encounter with the patient, after stepping away for a few minutes I reintroduced myself to him three separate times, and on no occasion did he recognize me. ? ? ?LKW: 2 PM ?tpa given?: no, not a stroke ? ? ?Past Medical History:  ?Diagnosis Date  ? Actinic keratosis   ? ? ? ?Family History  ?Problem Relation Age of Onset  ? Melanoma Brother   ? ? ? ?Social History:  reports that he has never smoked. He has never used smokeless tobacco. He reports current alcohol use. He reports that he does not use drugs. ? ? ?Exam: ?Current vital signs: ?BP (!) 196/86   Pulse 64   Resp 17   SpO2 99%  ?Vital signs in last 24 hours: ?Pulse Rate:  [64-68] 64 (05/16 1435) ?Resp:  [17-18] 17 (05/16 1435) ?BP: (122-196)/(76-86) 196/86 (05/16 1435) ?SpO2:  [99 %-100 %] 99 % (05/16 1435) ? ? ?Physical Exam  ?Constitutional: Appears well-developed and well-nourished.  ?Psych: Affect appropriate to situation ?Eyes: No scleral injection ?HENT: No OP obstruction ?MSK: no joint deformities.  ?Cardiovascular: Normal rate and regular rhythm.  ?Respiratory: Effort normal, non-labored breathing ?GI: Soft.  No distension. There is no tenderness.  ?Skin: WDI ? ?Neuro: ?Mental  Status: ?Patient is awake, alert, oriented to person, place, month, year, and situation. ?Patient is able to give a clear and parent history of his past, excluding today. ?He has no memory of why he is in the hospital, or how he got here. ?No signs of aphasia or neglect ?He is able to tell me the number of quarters in $2.75 without difficulty ?Cranial Nerves: ?II: Visual Fields are full. Pupils are equal, round, and reactive to light.   ?III,IV, VI: EOMI without ptosis or diploplia.  ?V: Facial sensation is symmetric to temperature ?VII: Facial movement is symmetric.  ?VIII: hearing is intact to voice ?X: Uvula elevates symmetrically ?XI: Shoulder shrug is symmetric. ?XII: tongue is midline without atrophy or fasciculations.  ?Motor: ?Tone is normal. Bulk is normal. 5/5 strength was present in all four extremities.  ?Sensory: ?Sensation is symmetric to light touch and temperature in the arms and legs. ?Cerebellar: ?FNF and HKS are intact bilaterally ? ? ? ? ? ?I have reviewed labs in epic and the results pertinent to this consultation are: ?CMP-unremarkable ? ?I have reviewed the images obtained: CT head-unremarkable ? 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  This constellation of symptoms is pathognomonic for transient global amnesia.  I have very low suspicion for seizure.  Occasionally people presenting with TGA will have diffusion positivity in the hippocampus, and therefore I think an MRI would be reasonable,  but very low suspicion that this represents TIA or stroke either.  He will need to be observed, and if MRI is negative, he can be discharged once he is reliably forming new memories again. ? ?There is a relatively low recurrence rate, and if recurrence does occur, there is typically a large separation in time between events.  If he were to have recurrence soon, or further features of concern present themselves, then further work-up could be obtained  at that time.  ? ?Recommendations: ?1) MRI brain ?2) if negative, patient should be observed while unable to form new memories given the danger he would present to himself, but once able to reliably form new memories no further work-up is needed. ? ? ?Vincent Rack, MD ?[facility] ?[phone] ? ?If 7pm- 7am, please page neurology on call as listed in [place]. ? ?

## 2022-03-16 NOTE — Assessment & Plan Note (Addendum)
Started on IV Rocephin.  Urine culture grew out 20,000 colonies of gram-positive cocci.  Patient discharged on 2 more days of p.o. Bactrim. ? ?

## 2022-03-16 NOTE — Assessment & Plan Note (Addendum)
Work-up for stroke negative.  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  Symptoms improved several hours after onset.  Cause is unknown and rare, approximately 30 incidents per 100,000 population.  Self-limiting and extremely uncommon for repeat occurrence.  By 5/17, patient's memory fully intact.  It was explained that during this period of time, he will not have any recall of any events that happened since he did not make memories of these events, but should have no problems moving forward. ?

## 2022-03-16 NOTE — Code Documentation (Signed)
Stroke Response Nurse Documentation ?Code Documentation ? ?Vincent Knapp is a 76 y.o. male arriving to [facility] ED via [employer] on 5/16.Code stroke was activated by ED triage RN.  ? ?Patient arrived driving himself, to front desk with initial reports of abd pain. While being triaged pt became suddenly confused and was asking where he was, what day is it, how did he get here.Code stroke was called for sudden confusion. Pt does not have any deficits noted. ? ?Stroke team at the bedside on patient arrival. Labs drawn and patient cleared for CT by Dr. Kerman Passey. Patient to CT with team. NIHSS 0, see documentation for details and code stroke times. The following imaging was completed:  CT head. Patient is not a candidate for IV Thrombolytic per Dr Leonel Ramsay, it is thought that pt is having Transient Global Amnesia.   ? ?Bedside handoff with ED RN.   ? ?Velta Addison ?Stroke Coordinator RN ? ? ?

## 2022-03-16 NOTE — ED Notes (Signed)
Code  stroke  called  to   [place]  at  [place] ?

## 2022-03-16 NOTE — Assessment & Plan Note (Addendum)
Blood pressure elevated during hospitalization, in part due to confusion.  Stable on discharge, continue ACE inhibitor ?

## 2022-03-16 NOTE — ED Notes (Addendum)
Pt up to desk and states did I check in. This RN asks pt what he is being seen for. Pt states belly pain but then states he feels disoriented and confused. Pt asked where he was and then states numbness, headache and once again asked where he was.  ? ?CODE STROKE called to Charge for room. ? ?Per Myah pt was acting when first checked in. ?

## 2022-03-16 NOTE — ED Triage Notes (Signed)
Pt to ED via POV with initial c/o abdominal pain. Pt wearing work shirt and states he came from work and then later states he doesn't where he was.  Pt confused and repeatedly asking if he is in [place], what day it is and how he got here. Pt reports HA, dizziness, numbness in both arms, AMS. LKW 2pm upon check in. PA in room and CODE STROKE called.  ? ?CBG 178.  ?

## 2022-03-17 DIAGNOSIS — R4182 Altered mental status, unspecified: Secondary | ICD-10-CM | POA: Diagnosis not present

## 2022-03-17 DIAGNOSIS — G454 Transient global amnesia: Secondary | ICD-10-CM

## 2022-03-17 DIAGNOSIS — E663 Overweight: Secondary | ICD-10-CM

## 2022-03-17 DIAGNOSIS — N3000 Acute cystitis without hematuria: Secondary | ICD-10-CM | POA: Diagnosis not present

## 2022-03-17 DIAGNOSIS — I10 Essential (primary) hypertension: Secondary | ICD-10-CM | POA: Diagnosis not present

## 2022-03-17 LAB — SEDIMENTATION RATE: Sed Rate: 2 mm/hr (ref 0–20)

## 2022-03-17 LAB — CBC
HCT: 45.2 % (ref 39.0–52.0)
Hemoglobin: 15.5 g/dL (ref 13.0–17.0)
MCH: 30.6 pg (ref 26.0–34.0)
MCHC: 34.3 g/dL (ref 30.0–36.0)
MCV: 89.2 fL (ref 80.0–100.0)
Platelets: 137 10*3/uL — ABNORMAL LOW (ref 150–400)
RBC: 5.07 MIL/uL (ref 4.22–5.81)
RDW: 12.8 % (ref 11.5–15.5)
WBC: 8.9 10*3/uL (ref 4.0–10.5)
nRBC: 0 % (ref 0.0–0.2)

## 2022-03-17 LAB — T4, FREE: Free T4: 0.9 ng/dL (ref 0.61–1.12)

## 2022-03-17 LAB — C-REACTIVE PROTEIN: CRP: 0.5 mg/dL (ref <1.0)

## 2022-03-17 LAB — COMPREHENSIVE METABOLIC PANEL
ALT: 26 U/L (ref 0–44)
AST: 20 U/L (ref 15–41)
Albumin: 3.6 g/dL (ref 3.5–5.0)
Alkaline Phosphatase: 41 U/L (ref 38–126)
Anion gap: 7 (ref 5–15)
BUN: 16 mg/dL (ref 8–23)
CO2: 27 mmol/L (ref 22–32)
Calcium: 8.7 mg/dL — ABNORMAL LOW (ref 8.9–10.3)
Chloride: 105 mmol/L (ref 98–111)
Creatinine, Ser: 1.01 mg/dL (ref 0.61–1.24)
GFR, Estimated: >60 mL/min (ref >60)
Glucose, Bld: 128 mg/dL — ABNORMAL HIGH (ref 70–99)
Potassium: 3.9 mmol/L (ref 3.5–5.1)
Sodium: 139 mmol/L (ref 135–145)
Total Bilirubin: 1.2 mg/dL (ref 0.3–1.2)
Total Protein: 6 g/dL — ABNORMAL LOW (ref 6.5–8.1)

## 2022-03-17 LAB — VITAMIN B12: Vitamin B-12: 354 pg/mL (ref 180–914)

## 2022-03-17 LAB — GLUCOSE, CAPILLARY
Glucose-Capillary: 111 mg/dL — ABNORMAL HIGH (ref 70–99)
Glucose-Capillary: 146 mg/dL — ABNORMAL HIGH (ref 70–99)

## 2022-03-17 LAB — BRAIN NATRIURETIC PEPTIDE: B Natriuretic Peptide: 49.3 pg/mL (ref 0.0–100.0)

## 2022-03-17 LAB — RPR: RPR Ser Ql: NONREACTIVE

## 2022-03-17 MED ORDER — SULFAMETHOXAZOLE-TRIMETHOPRIM 800-160 MG PO TABS: 1.0000 | ORAL_TABLET | Freq: Two times a day (BID) | ORAL | 0 refills | Status: DC

## 2022-03-17 NOTE — TOC CM/SW Note (Signed)
Patient has orders to discharge home today. Chart reviewed. PCP is Frazier Richards, MD. On room air. No wounds. No TOC needs identified. CSW signing off. ? ?Dayton Scrape, [place] ?[phone] ? ?

## 2022-03-17 NOTE — Hospital Course (Signed)
76 year old male with past medical history of hyperlipidemia and hypertension who presented to the emergency room with complaints of some abdominal pain and it was noted that patient appeared to be quite confused.  Specifically, he kept asking the same question over and over, even after being given the answer.  The patient is normally fully alert and oriented x3 with no history of dementia.  Brought in for further evaluation to the hospitalist service and neurology consulted.  Also found to have an incidental UTI. ? ?CT and MRI negative.  Seen by neurology and patient felt to have transient global amnesia, an isolated self limiting incident which improved a few hours after admission. ?

## 2022-03-17 NOTE — Assessment & Plan Note (Signed)
Meets criteria with BMI greater than 25 

## 2022-03-17 NOTE — Progress Notes (Signed)
Subjective: ?He started forming new memories around 3 am. He is pretty much back to normal.  ? ?Exam: ?Vitals:  ? 03/17/22 0454 03/17/22 0759  ?BP: (!) 143/72 (!) 155/74  ?Pulse: 61 62  ?Resp: 17 18  ?Temp: 98 ?F (36.7 ?C) 98.4 ?F (36.9 ?C)  ?SpO2: 96% 96%  ? ?Gen: In bed, NAD ?Resp: non-labored breathing, no acute distress ? ?Neuro: ?MS: awake, alert, interactive and appropriate ?ZH:GDJM, face symmetric ?Motor: MAEW ? ?Pertinent Labs: ?UA +  ? ?Impression: 76 yo M who presented with characteristic presentation of transient global amnesia. With this presentation and negative MRI, I do not feel he needs any further workup. With seizure induced amnesia, I would expect clouded cognition which he did not demonstrate, and given the duration, do not feel this is a significant consideration. There is a small percentage of patients who will have recurrance, but it is not felt to herald other memory disorders, seizures, or other medical problems.  ? ?Recommendations: ?1)No further specific testing,restrictions, or follow up needed unless he has further events.  ?2) Neurology will be available as needed.  ? ?Roland Rack, MD ?[facility] ?[phone] ? ?If 7pm- 7am, please page neurology on call as listed in [place]. ? ?

## 2022-03-17 NOTE — Discharge Summary (Signed)
?Physician Discharge Summary ?  ?Patient: Vincent Knapp MRN: [id number] DOB: January 06, 1946  ?Admit date:     03/16/2022  ?Discharge date: 03/17/22  ?Discharge Physician: Annita Brod  ? ?PCP: Kirk Ruths, MD  ? ?Recommendations at discharge:  ? ?New medication: Bactrim DS p.o. twice daily x2 days ? ?Discharge Diagnoses: ?Principal Problem: ?  Transient global amnesia ?Active Problems: ?  UTI (urinary tract infection) ?  Hypertension ?  Hyperlipidemia ?  Overweight (BMI 25.0-29.9) ? ?Resolved Problems: ?  * No resolved hospital problems. * ? ?Hospital Course: ?76 year old male with past medical history of hyperlipidemia and hypertension who presented to the emergency room with complaints of some abdominal pain and it was noted that patient appeared to be quite confused.  Specifically, he kept asking the same question over and over, even after being given the answer.  The patient is normally fully alert and oriented x3 with no history of dementia.  Brought in for further evaluation to the hospitalist service and neurology consulted.  Also found to have an incidental UTI. ? ?CT and MRI negative.  Seen by neurology and patient felt to have transient global amnesia, an isolated self limiting incident which improved a few hours after admission. ? ?Assessment and Plan: ?* Transient global amnesia ?Work-up for stroke negative.  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  Symptoms improved several hours after onset.  Cause is unknown and rare, approximately 30 incidents per 100,000 population.  Self-limiting and extremely uncommon for repeat occurrence.  By 5/17, patient's memory fully intact.  It was explained that during this period of time, he will not have any recall of any events that happened since he did not make memories of these events, but should have no problems moving forward. ? ?UTI (urinary tract infection) ?Started on IV Rocephin.  Urine culture grew out 20,000 colonies  of gram-positive cocci.  Patient discharged on 2 more days of p.o. Bactrim. ? ? ?Hypertension ?Blood pressure elevated during hospitalization, in part due to confusion.  Stable on discharge, continue ACE inhibitor ? ?Hyperlipidemia ?Continue on statin ? ?Overweight (BMI 25.0-29.9) ?Meets criteria with BMI greater than 25 ? ? ? ? ?  ? ? ?Consultants: Neurology ?Procedures performed: None ?Disposition: Home ?Diet recommendation:  ?Discharge Diet Orders (From admission, onward)  ? ?  Start     Ordered  ? 03/17/22 0000  Diet - low sodium heart healthy       ? 03/17/22 1106  ? ?  ?  ? ?  ? ?Cardiac diet ?DISCHARGE MEDICATION: ?Allergies as of 03/17/2022   ? ?   Reactions  ? Codeine Nausea Only  ? ?  ? ?  ?Medication List  ?  ? ?STOP taking these medications   ? ?methylPREDNISolone 4 MG Tbpk tablet ?Commonly known as: MEDROL DOSEPAK ?  ? ?  ? ?TAKE these medications   ? ?doxylamine (Sleep) 25 MG tablet ?Commonly known as: UNISOM ?Take 25 mg by mouth at bedtime. ?  ?lisinopril 20 MG tablet ?Commonly known as: ZESTRIL ?Take 20 mg by mouth at bedtime. ?  ?meloxicam 15 MG tablet ?Commonly known as: MOBIC ?Take 15 mg by mouth daily. ?  ?multivitamin with minerals Tabs tablet ?Take 1 tablet by mouth daily. ?  ?rosuvastatin 20 MG tablet ?Commonly known as: CRESTOR ?Take 20 mg by mouth at bedtime. ?  ?sulfamethoxazole-trimethoprim 800-160 MG tablet ?Commonly known as: BACTRIM DS ?Take 1 tablet by mouth 2 (two) times daily. ?  ? ?  ? ? ?  Discharge Exam: ?Filed Weights  ? 03/16/22 2150  ?Weight: 83.5 kg  ? ?General: Alert and oriented x3, no acute distress ?Cardiovascular: Regular rate and rhythm, S1-S2 ?Lungs: Clear to auscultation bilaterally ? ?Condition at discharge: good ? ?The results of significant diagnostics from this hospitalization (including imaging, microbiology, ancillary and laboratory) are listed below for reference.  ? ?Imaging Studies: ?MR BRAIN WO CONTRAST ? ?Result Date: 03/16/2022 ?CLINICAL DATA:  Provided  history: Memory loss. EXAM: MRI HEAD WITHOUT CONTRAST TECHNIQUE: Multiplanar, multiecho pulse sequences of the brain and surrounding structures were obtained without intravenous contrast. COMPARISON:  Noncontrast head CT 03/16/2022. FINDINGS: Brain: No age-advanced or lobar predominant parenchymal atrophy. A few small scattered foci of T2 FLAIR hyperintense signal abnormality within the cerebral white matter are nonspecific, but likely reflect minimal changes of chronic small vessel ischemia. No cortical encephalomalacia is identified. There is no acute infarct. No evidence of an intracranial mass. No extra-axial fluid collection. No midline shift. Vascular: Maintained flow voids within the proximal large arterial vessels. Skull and upper cervical spine: No focal suspicious marrow lesion. Incompletely assessed cervical spondylosis. Sinuses/Orbits: No mass or acute finding within the imaged orbits. Minimal mucosal thickening within the bilateral ethmoid sinuses. Frothy secretions within the left sphenoid sinus. Other: Trace fluid within the right mastoid air cells. IMPRESSION: No evidence of acute intracranial abnormality. Minimal chronic small vessel ischemic changes within the cerebral white matter. No age-advanced or lobar predominant parenchymal atrophy. Paranasal sinus disease, as described. Trace fluid within the right mastoid air cells. Electronically Signed   By: Kellie Simmering D.O.   On: 03/16/2022 15:50  ? ?CT ABDOMEN PELVIS W CONTRAST ? ?Result Date: 03/16/2022 ?CLINICAL DATA:  Acute abdominal pain.  Altered mental status. EXAM: CT ABDOMEN AND PELVIS WITH CONTRAST TECHNIQUE: Multidetector CT imaging of the abdomen and pelvis was performed using the standard protocol following bolus administration of intravenous contrast. RADIATION DOSE REDUCTION: This exam was performed according to the departmental dose-optimization program which includes automated exposure control, adjustment of the mA and/or kV according  to patient size and/or use of iterative reconstruction technique. CONTRAST:  141m OMNIPAQUE IOHEXOL 300 MG/ML  SOLN COMPARISON:  Noncontrast CT on 03/22/2018 FINDINGS: Lower Chest: No acute findings. 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 Hepatobiliary: No hepatic masses identified. Prior cholecystectomy. No evidence of biliary obstruction. Pancreas:  No mass or inflammatory changes. Spleen: Within normal limits in size and appearance. Adrenals/Urinary Tract: No masses identified. No evidence of ureteral calculi or hydronephrosis. Stomach/Bowel: No evidence of obstruction, inflammatory process or abnormal fluid collections. Diffuse colonic diverticulosis noted, however there is no evidence of diverticulitis. Vascular/Lymphatic: No pathologically enlarged lymph nodes. No acute vascular findings. Reproductive:  Mildly enlarged prostate. Other:  None. Musculoskeletal:  No suspicious bone lesions identified. IMPRESSION: Colonic diverticulosis, without radiographic evidence of diverticulitis or other acute findings. Mildly enlarged prostate. Electronically Signed   By: JMarlaine HindM.D.   On: 03/16/2022 16:20  ? ?CT HEAD CODE STROKE WO CONTRAST ? ?Result Date: 03/16/2022 ?CLINICAL DATA:  Code stroke.  Neuro deficit, acute, stroke suspected EXAM: CT HEAD WITHOUT CONTRAST TECHNIQUE: Contiguous axial images were obtained from the base of the skull through the vertex without intravenous contrast. RADIATION DOSE REDUCTION: This exam was performed according to the departmental dose-optimization program which includes automated exposure control, adjustment of the mA and/or kV according to patient size and/or use of iterative reconstruction technique. COMPARISON:  None Available. FINDINGS: Brain: No evidence of acute large vascular territory infarction, hemorrhage, hydrocephalus, extra-axial  collection or mass lesion/mass effect. Prominence of the extra-axial spaces along the frontal convexities. Vascular:  No hyperdense vessel identified. Calcific intracranial atherosclerosis Skull: No acute fracture. Sinuses/Orbits: Clear visualized sinuses. No acute orbital findings. Other: Trace right mastoid effusion ASPECT

## 2022-03-18 LAB — URINE CULTURE: Culture: 20000 — AB

## 2022-03-18 LAB — ANA W/REFLEX: Anti Nuclear Antibody (ANA): NEGATIVE

## 2022-04-07 DIAGNOSIS — S8391XA Sprain of unspecified site of right knee, initial encounter: Secondary | ICD-10-CM | POA: Diagnosis not present

## 2022-04-07 DIAGNOSIS — M2391 Unspecified internal derangement of right knee: Secondary | ICD-10-CM | POA: Diagnosis not present

## 2022-04-07 DIAGNOSIS — M25561 Pain in right knee: Secondary | ICD-10-CM | POA: Diagnosis not present

## 2022-04-08 DIAGNOSIS — M76822 Posterior tibial tendinitis, left leg: Secondary | ICD-10-CM | POA: Diagnosis not present

## 2022-04-26 ENCOUNTER — Ambulatory Visit: Payer: Medicare HMO | Admitting: Dermatology

## 2022-04-26 DIAGNOSIS — L720 Epidermal cyst: Secondary | ICD-10-CM | POA: Diagnosis not present

## 2022-04-26 DIAGNOSIS — L719 Rosacea, unspecified: Secondary | ICD-10-CM | POA: Diagnosis not present

## 2022-04-26 DIAGNOSIS — D229 Melanocytic nevi, unspecified: Secondary | ICD-10-CM

## 2022-04-26 DIAGNOSIS — L578 Other skin changes due to chronic exposure to nonionizing radiation: Secondary | ICD-10-CM | POA: Diagnosis not present

## 2022-04-26 DIAGNOSIS — L570 Actinic keratosis: Secondary | ICD-10-CM | POA: Diagnosis not present

## 2022-04-26 DIAGNOSIS — Z1283 Encounter for screening for malignant neoplasm of skin: Secondary | ICD-10-CM | POA: Diagnosis not present

## 2022-04-26 DIAGNOSIS — D239 Other benign neoplasm of skin, unspecified: Secondary | ICD-10-CM

## 2022-04-26 DIAGNOSIS — D225 Melanocytic nevi of trunk: Secondary | ICD-10-CM | POA: Diagnosis not present

## 2022-04-26 DIAGNOSIS — D2361 Other benign neoplasm of skin of right upper limb, including shoulder: Secondary | ICD-10-CM

## 2022-04-26 MED ORDER — FLUOROURACIL 5 % EX CREA: TOPICAL_CREAM | Freq: Two times a day (BID) | CUTANEOUS | 1 refills | Status: DC

## 2022-07-27 DIAGNOSIS — J1189 Influenza due to unidentified influenza virus with other manifestations: Secondary | ICD-10-CM | POA: Diagnosis not present

## 2022-11-09 ENCOUNTER — Ambulatory Visit: Payer: Medicare HMO | Admitting: Dermatology

## 2022-11-09 DIAGNOSIS — L720 Epidermal cyst: Secondary | ICD-10-CM

## 2022-11-09 DIAGNOSIS — L814 Other melanin hyperpigmentation: Secondary | ICD-10-CM | POA: Diagnosis not present

## 2022-11-09 DIAGNOSIS — Z872 Personal history of diseases of the skin and subcutaneous tissue: Secondary | ICD-10-CM

## 2022-11-09 DIAGNOSIS — L820 Inflamed seborrheic keratosis: Secondary | ICD-10-CM

## 2022-11-09 DIAGNOSIS — L821 Other seborrheic keratosis: Secondary | ICD-10-CM

## 2022-11-09 DIAGNOSIS — L578 Other skin changes due to chronic exposure to nonionizing radiation: Secondary | ICD-10-CM | POA: Diagnosis not present

## 2022-11-09 NOTE — Patient Instructions (Signed)
Cryotherapy Aftercare  Wash gently with soap and water everyday.   Apply Vaseline and Band-Aid daily until healed.     Due to recent changes in healthcare laws, you may see results of your pathology and/or laboratory studies on MyChart before the doctors have had a chance to review them. We understand that in some cases there may be results that are confusing or concerning to you. Please understand that not all results are received at the same time and often the doctors may need to interpret multiple results in order to provide you with the best plan of care or course of treatment. Therefore, we ask that you please give us 2 business days to thoroughly review all your results before contacting the office for clarification. Should we see a critical lab result, you will be contacted sooner.   If You Need Anything After Your Visit  If you have any questions or concerns for your doctor, please call our main line at [phone] and press option 4 to reach your doctor's medical assistant. If no one answers, please leave a voicemail as directed and we will return your call as soon as possible. Messages left after 4 pm will be answered the following business day.   You may also send us a message via MyChart. We typically respond to MyChart messages within 1-2 business days.  For prescription refills, please ask your pharmacy to contact our office. Our fax number is [phone].  If you have an urgent issue when the clinic is closed that cannot wait until the next business day, you can page your doctor at the number below.    Please note that while we do our best to be available for urgent issues outside of office hours, we are not available 24/7.   If you have an urgent issue and are unable to reach us, you may choose to seek medical care at your doctor's office, retail clinic, urgent care center, or emergency room.  If you have a medical emergency, please immediately call 911 or go to the  emergency department.  Pager Numbers  - Dr. Kowalski: [phone]  - Dr. Moye: [phone]  - Dr. Stewart: [phone]  In the event of inclement weather, please call our main line at [phone] for an update on the status of any delays or closures.  Dermatology Medication Tips: Please keep the boxes that topical medications come in in order to help keep track of the instructions about where and how to use these. Pharmacies typically print the medication instructions only on the boxes and not directly on the medication tubes.   If your medication is too expensive, please contact our office at [phone] option 4 or send us a message through MyChart.   We are unable to tell what your co-pay for medications will be in advance as this is different depending on your insurance coverage. However, we may be able to find a substitute medication at lower cost or fill out paperwork to get insurance to cover a needed medication.   If a prior authorization is required to get your medication covered by your insurance company, please allow us 1-2 business days to complete this process.  Drug prices often vary depending on where the prescription is filled and some pharmacies may offer cheaper prices.  The website www.goodrx.com contains coupons for medications through different pharmacies. The prices here do not account for what the cost may be with help from insurance (it may be cheaper with your insurance), but the website can   give you the price if you did not use any insurance.  - You can print the associated coupon and take it with your prescription to the pharmacy.  - You may also stop by our office during regular business hours and pick up a GoodRx coupon card.  - If you need your prescription sent electronically to a different pharmacy, notify our office through [facility] MyChart or by phone at [phone] option 4.     Si Usted Necesita Algo Despus de Su Visita  Tambin puede  enviarnos un mensaje a travs de MyChart. Por lo general respondemos a los mensajes de MyChart en el transcurso de 1 a 2 das hbiles.  Para renovar recetas, por favor pida a su farmacia que se ponga en contacto con nuestra oficina. Nuestro nmero de fax es el [phone].  Si tiene un asunto urgente cuando la clnica est cerrada y que no puede esperar hasta el siguiente da hbil, puede llamar/localizar a su doctor(a) al nmero que aparece a continuacin.   Por favor, tenga en cuenta que aunque hacemos todo lo posible para estar disponibles para asuntos urgentes fuera del horario de oficina, no estamos disponibles las 24 horas del da, los 7 das de la semana.   Si tiene un problema urgente y no puede comunicarse con nosotros, puede optar por buscar atencin mdica  en el consultorio de su doctor(a), en una clnica privada, en un centro de atencin urgente o en una sala de emergencias.  Si tiene una emergencia mdica, por favor llame inmediatamente al 911 o vaya a la sala de emergencias.  Nmeros de bper  - Dr. Kowalski: [phone]  - Dra. Moye: [phone]  - Dra. Stewart: [phone]  En caso de inclemencias del tiempo, por favor llame a nuestra lnea principal al [phone] para una actualizacin sobre el estado de cualquier retraso o cierre.  Consejos para la medicacin en dermatologa: Por favor, guarde las cajas en las que vienen los medicamentos de uso tpico para ayudarle a seguir las instrucciones sobre dnde y cmo usarlos. Las farmacias generalmente imprimen las instrucciones del medicamento slo en las cajas y no directamente en los tubos del medicamento.   Si su medicamento es muy caro, por favor, pngase en contacto con nuestra oficina llamando al [phone] y presione la opcin 4 o envenos un mensaje a travs de MyChart.   No podemos decirle cul ser su copago por los medicamentos por adelantado ya que esto es diferente dependiendo de la cobertura de su seguro.  Sin embargo, es posible que podamos encontrar un medicamento sustituto a menor costo o llenar un formulario para que el seguro cubra el medicamento que se considera necesario.   Si se requiere una autorizacin previa para que su compaa de seguros cubra su medicamento, por favor permtanos de 1 a 2 das hbiles para completar este proceso.  Los precios de los medicamentos varan con frecuencia dependiendo del lugar de dnde se surte la receta y alguna farmacias pueden ofrecer precios ms baratos.  El sitio web www.goodrx.com tiene cupones para medicamentos de diferentes farmacias. Los precios aqu no tienen en cuenta lo que podra costar con la ayuda del seguro (puede ser ms barato con su seguro), pero el sitio web puede darle el precio si no utiliz ningn seguro.  - Puede imprimir el cupn correspondiente y llevarlo con su receta a la farmacia.  - Tambin puede pasar por nuestra oficina durante el horario de atencin regular y recoger una tarjeta de cupones de GoodRx.  -   Si necesita que su receta se enve electrnicamente a una farmacia diferente, informe a nuestra oficina a travs de MyChart de [place] o por telfono llamando al [phone] y presione la opcin 4.  

## 2022-11-09 NOTE — Progress Notes (Signed)
Follow-Up Visit   Subjective  Vincent Knapp is a 77 y.o. male who presents for the following: Skin Problem (Patient here today for a spot at arm that was larger but has gone down, also with one at right top of shoulder that has gotten larger and sometimes itches.). Also irritated spots at neck.  Hx of AKs  The following portions of the chart were reviewed this encounter and updated as appropriate:       Review of Systems:  No other skin or systemic complaints except as noted in HPI or Assessment and Plan.  Objective  Well appearing patient in no apparent distress; mood and affect are within normal limits.  A focused examination was performed including scalp, arms, shoulders. Relevant physical exam findings are noted in the Assessment and Plan.  Left Forearm 4 mm firm blue white smooth papule with adjacent mild dusky erythema       Right Shoulder x 1, right medial clavicle x 2, left upper clavicle x 2 (5) Erythematous stuck-on, waxy papule    Assessment & Plan  Epidermal inclusion cyst Left Forearm  Vs blue nevus, h/o inflammation, improving now.  Benign-appearing. Will observe for changes.  Recheck on f/up.  Inflamed seborrheic keratosis (5) Right Shoulder x 1, right medial clavicle x 2, left upper clavicle x 2  Symptomatic, irritating, patient would like treated.   Destruction of lesion - Right Shoulder x 1, right medial clavicle x 2, left upper clavicle x 2  Destruction method: cryotherapy   Informed consent: discussed and consent obtained   Lesion destroyed using liquid nitrogen: Yes   Region frozen until ice ball extended beyond lesion: Yes   Outcome: patient tolerated procedure well with no complications   Post-procedure details: wound care instructions given   Additional details:  Prior to procedure, discussed risks of blister formation, small wound, skin dyspigmentation, or rare scar following cryotherapy. Recommend Vaseline ointment to treated areas  while healing.   Lentigines - Scattered tan macules - Due to sun exposure - Benign-appearing, observe - Recommend daily broad spectrum sunscreen SPF 30+ to sun-exposed areas, reapply every 2 hours as needed. - Call for any changes  Seborrheic Keratoses - Stuck-on, waxy, tan-brown papules and/or plaques  - Benign-appearing - Discussed benign etiology and prognosis. - Observe - Call for any changes  History of PreCancerous Actinic Keratosis  - site(s) of PreCancerous Actinic Keratosis clear today. - these may recur and new lesions may form requiring treatment to prevent transformation into skin cancer - observe for new or changing spots and contact [facility] for appointment if occur - photoprotection with sun protective clothing; sunglasses and broad spectrum sunscreen with SPF of at least 30 + and frequent self skin exams recommended - yearly exams by a dermatologist recommended for persons with history of PreCancerous Actinic Keratoses  Actinic Damage - chronic, secondary to cumulative UV radiation exposure/sun exposure over time - diffuse scaly erythematous macules with underlying dyspigmentation - Recommend daily broad spectrum sunscreen SPF 30+ to sun-exposed areas, reapply every 2 hours as needed.  - Recommend staying in the shade or wearing long sleeves, sun glasses (UVA+UVB protection) and wide brim hats (4-inch brim around the entire circumference of the hat). - Call for new or changing lesions.  Return for as scheduled annual skin check.  Graciella Belton, RMA, am acting as scribe for Brendolyn Patty, MD .  Documentation: I have reviewed the above documentation for accuracy and completeness, and I agree with the above.  Brendolyn Patty  MD

## 2023-05-02 ENCOUNTER — Ambulatory Visit: Payer: Medicare HMO | Admitting: Dermatology

## 2023-05-02 VITALS — BP 125/76 | HR 57

## 2023-05-02 DIAGNOSIS — D2261 Melanocytic nevi of right upper limb, including shoulder: Secondary | ICD-10-CM | POA: Diagnosis not present

## 2023-05-02 DIAGNOSIS — W908XXA Exposure to other nonionizing radiation, initial encounter: Secondary | ICD-10-CM

## 2023-05-02 DIAGNOSIS — L814 Other melanin hyperpigmentation: Secondary | ICD-10-CM | POA: Diagnosis not present

## 2023-05-02 DIAGNOSIS — D224 Melanocytic nevi of scalp and neck: Secondary | ICD-10-CM | POA: Diagnosis not present

## 2023-05-02 DIAGNOSIS — L821 Other seborrheic keratosis: Secondary | ICD-10-CM

## 2023-05-02 DIAGNOSIS — L578 Other skin changes due to chronic exposure to nonionizing radiation: Secondary | ICD-10-CM

## 2023-05-02 DIAGNOSIS — D225 Melanocytic nevi of trunk: Secondary | ICD-10-CM | POA: Diagnosis not present

## 2023-05-02 DIAGNOSIS — Z1283 Encounter for screening for malignant neoplasm of skin: Secondary | ICD-10-CM

## 2023-05-02 DIAGNOSIS — L570 Actinic keratosis: Secondary | ICD-10-CM | POA: Diagnosis not present

## 2023-05-02 DIAGNOSIS — D485 Neoplasm of uncertain behavior of skin: Secondary | ICD-10-CM

## 2023-05-02 DIAGNOSIS — D229 Melanocytic nevi, unspecified: Secondary | ICD-10-CM

## 2023-05-02 DIAGNOSIS — L719 Rosacea, unspecified: Secondary | ICD-10-CM | POA: Diagnosis not present

## 2023-05-02 DIAGNOSIS — L738 Other specified follicular disorders: Secondary | ICD-10-CM

## 2023-05-02 DIAGNOSIS — Z872 Personal history of diseases of the skin and subcutaneous tissue: Secondary | ICD-10-CM | POA: Diagnosis not present

## 2023-05-02 NOTE — Progress Notes (Unsigned)
Follow-Up Visit   Subjective  Vincent Knapp is a 77 y.o. male who presents for the following: Skin Cancer Screening and Full Body Skin Exam  The patient presents for Total-Body Skin Exam (TBSE) for skin cancer screening and mole check. The patient has spots, moles and lesions to be evaluated, some may be new or changing, including a few spots on the face, right scalp, and redness of the nose. History of Aks, has used 5FU/Calcipotriene cream in the past. No history of skin cancer.     The following portions of the chart were reviewed this encounter and updated as appropriate: medications, allergies, medical history  Review of Systems:  No other skin or systemic complaints except as noted in HPI or Assessment and Plan.  Objective  Well appearing patient in no apparent distress; mood and affect are within normal limits.  A full examination was performed including scalp, head, eyes, ears, nose, lips, neck, chest, axillae, abdomen, back, buttocks, bilateral upper extremities, bilateral lower extremities, hands, feet, fingers, toes, fingernails, and toenails. All findings within normal limits unless otherwise noted below.   Relevant physical exam findings are noted in the Assessment and Plan.  right temple scalp 5 mm pink smooth papule       R post scalp x 1, L post scalp x 1 (2) Pink scaly macules.     Assessment & Plan   LENTIGINES, SEBORRHEIC KERATOSES, HEMANGIOMAS - Benign normal skin lesions - Benign-appearing - Call for any changes  MELANOCYTIC NEVI - Tan-brown and/or pink-flesh-colored symmetric macules and papules - Benign appearing on exam today - Observation - Call clinic for new or changing moles - Recommend daily use of broad spectrum spf 30+ sunscreen to sun-exposed areas.   ACTINIC DAMAGE - Chronic condition, secondary to cumulative UV/sun exposure - diffuse scaly erythematous macules with underlying dyspigmentation - Recommend daily broad spectrum  sunscreen SPF 30+ to sun-exposed areas, reapply every 2 hours as needed.  - Staying in the shade or wearing long sleeves, sun glasses (UVA+UVB protection) and wide brim hats (4-inch brim around the entire circumference of the hat) are also recommended for sun protection.  - Call for new or changing lesions.  Sebaceous Hyperplasia - Small yellow papules with a central dell of the right nasal root, left paranasal - Benign-appearing - Observe. Call for changes.  SKIN CANCER SCREENING PERFORMED TODAY.  BLUE NEVUS Exam: 1.5 mm gray blue macule of the right hand dorsum, stable  Treatment Plan: Benign appearing on exam today. Recommend observation. Call clinic for new or changing moles. Recommend daily use of broad spectrum spf 30+ sunscreen to sun-exposed areas.   NEVUS  Exam: 3.0 x 2.0 mm medium dark brown macule, slightly waxy of the left lower flank, stable   Treatment Plan: Benign appearing on exam today. Recommend observation. Call clinic for new or changing moles. Recommend daily use of broad spectrum spf 30+ sunscreen to sun-exposed areas.   Neoplasm of uncertain behavior of skin right temple scalp  Epidermal / dermal shaving  Lesion diameter (cm):  0.6 Informed consent: discussed and consent obtained   Patient was prepped and draped in usual sterile fashion: Area prepped with alcohol. Anesthesia: the lesion was anesthetized in a standard fashion   Anesthetic:  1% lidocaine w/ epinephrine 1-100,000 buffered w/ 8.4% NaHCO3 Instrument used: flexible razor blade   Hemostasis achieved with: pressure, aluminum chloride and electrodesiccation   Outcome: patient tolerated procedure well   Post-procedure details: wound care instructions given   Post-procedure details comment:  Ointment and small bandage applied  Specimen 1 - Surgical pathology Differential Diagnosis: Irritated nevus vs Neurofibroma r/o BCC Check Margins: No  AK (actinic keratosis) (2) R post scalp x 1, L post scalp  x 1  Actinic keratoses are precancerous spots that appear secondary to cumulative UV radiation exposure/sun exposure over time. They are chronic with expected duration over 1 year. A portion of actinic keratoses will progress to squamous cell carcinoma of the skin. It is not possible to reliably predict which spots will progress to skin cancer and so treatment is recommended to prevent development of skin cancer.  Recommend daily broad spectrum sunscreen SPF 30+ to sun-exposed areas, reapply every 2 hours as needed.  Recommend staying in the shade or wearing long sleeves, sun glasses (UVA+UVB protection) and wide brim hats (4-inch brim around the entire circumference of the hat). Call for new or changing lesions.  Destruction of lesion - R post scalp x 1, L post scalp x 1  Destruction method: cryotherapy   Informed consent: discussed and consent obtained   Lesion destroyed using liquid nitrogen: Yes   Region frozen until ice ball extended beyond lesion: Yes   Outcome: patient tolerated procedure well with no complications   Post-procedure details: wound care instructions given   Additional details:  Prior to procedure, discussed risks of blister formation, small wound, skin dyspigmentation, or rare scar following cryotherapy. Recommend Vaseline ointment to treated areas while healing.   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 Condition is bothersome/symptomatic for patient. Currently flared.   Rosacea is a chronic progressive skin condition usually affecting the face of adults, causing redness and/or acne bumps. It is treatable but not curable. It sometimes affects the eyes (ocular rosacea) as well. It may respond to topical and/or systemic medication and can flare with stress, sun exposure, alcohol, exercise, topical steroids (including hydrocortisone/cortisone 10) and some foods.  Daily  application of broad spectrum spf 30+ sunscreen to face is recommended to reduce flares.  Patient denies grittiness of the eyes  Treatment Plan Start Azelaic Acid: 15%, Ivermectin: 1%, Metronidazole: 1% Cream - apply to face nightly for rosacea. Rx sent to [facility].   Discussed adding oral doxycycline if not improving with topical. No improvement with metronidazole 0.75% gel in the past.  Counseling for BBL / IPL / Laser and Coordination of Care Discussed the treatment option of Broad Band Light (BBL) /Intense Pulsed Light (IPL)/ Laser for skin discoloration, including brown spots and redness.  Typically we recommend at least 1-3 treatment sessions about 5-8 weeks apart for best results.  Cannot have tanned skin when BBL performed, and regular use of sunscreen is advised after the procedure to help maintain results. The patient's condition may also require "maintenance treatments" in the future.  The fee for BBL / laser treatments is $350 per treatment session for the whole face.  A fee can be quoted for other parts of the body.  Insurance typically does not pay for BBL/laser treatments and therefore the fee is an out-of-pocket cost.   Return in about 1 year (around 05/01/2024) for TBSE, Hx AKs.  ICherlyn Labella, CMA, am acting as scribe for Willeen Niece, MD .   Documentation: I have reviewed the above documentation for accuracy and completeness, and I agree with the above.  Willeen Niece, MD

## 2023-05-02 NOTE — Patient Instructions (Addendum)
Instructions for [facility] Medications  One or more of your medications was sent to the [facility] mail order compounding pharmacy. You will receive an email from them and can purchase the medicine through that link. It will then be mailed to your home at the address you confirmed. If for any reason you do not receive an email from them, please check your spam folder. If you still do not find the email, please let us know. [facility] phone number is [phone].      Wound Care Instructions  Cleanse wound gently with soap and water once a day then pat dry with clean gauze. Apply a thin coat of Petrolatum (petroleum jelly, "Vaseline") over the wound (unless you have an allergy to this). We recommend that you use a new, sterile tube of Vaseline. Do not pick or remove scabs. Do not remove the yellow or white "healing tissue" from the base of the wound.  Cover the wound with fresh, clean, nonstick gauze and secure with paper tape. You may use Band-Aids in place of gauze and tape if the wound is small enough, but would recommend trimming much of the tape off as there is often too much. Sometimes Band-Aids can irritate the skin.  You should call the office for your biopsy report after 1 week if you have not already been contacted.  If you experience any problems, such as abnormal amounts of bleeding, swelling, significant bruising, significant pain, or evidence of infection, please call the office immediately.  FOR ADULT SURGERY PATIENTS: If you need something for pain relief you may take 1 extra strength Tylenol (acetaminophen) AND 2 Ibuprofen (200mg each) together every 4 hours as needed for pain. (do not take these if you are allergic to them or if you have a reason you should not take them.) Typically, you may only need pain medication for 1 to 3 days.   Due to recent changes in healthcare laws, you may see results of your pathology and/or laboratory studies on MyChart before the  doctors have had a chance to review them. We understand that in some cases there may be results that are confusing or concerning to you. Please understand that not all results are received at the same time and often the doctors may need to interpret multiple results in order to provide you with the best plan of care or course of treatment. Therefore, we ask that you please give us 2 business days to thoroughly review all your results before contacting the office for clarification. Should we see a critical lab result, you will be contacted sooner.   If You Need Anything After Your Visit  If you have any questions or concerns for your doctor, please call our main line at [phone] and press option 4 to reach your doctor's medical assistant. If no one answers, please leave a voicemail as directed and we will return your call as soon as possible. Messages left after 4 pm will be answered the following business day.   You may also send us a message via MyChart. We typically respond to MyChart messages within 1-2 business days.  For prescription refills, please ask your pharmacy to contact our office. Our fax number is [phone].  If you have an urgent issue when the clinic is closed that cannot wait until the next business day, you can page your doctor at the number below.    Please note that while we do our best to be available for urgent issues outside of   office hours, we are not available 24/7.   If you have an urgent issue and are unable to reach us, you may choose to seek medical care at your doctor's office, retail clinic, urgent care center, or emergency room.  If you have a medical emergency, please immediately call 911 or go to the emergency department.  Pager Numbers  - Dr. Kowalski: [phone]  - Dr. Moye: [phone]  - Dr. Stewart: [phone]  In the event of inclement weather, please call our main line at [phone] for an update on the status of any delays or  closures.  Dermatology Medication Tips: Please keep the boxes that topical medications come in in order to help keep track of the instructions about where and how to use these. Pharmacies typically print the medication instructions only on the boxes and not directly on the medication tubes.   If your medication is too expensive, please contact our office at [phone] option 4 or send us a message through MyChart.   We are unable to tell what your co-pay for medications will be in advance as this is different depending on your insurance coverage. However, we may be able to find a substitute medication at lower cost or fill out paperwork to get insurance to cover a needed medication.   If a prior authorization is required to get your medication covered by your insurance company, please allow us 1-2 business days to complete this process.  Drug prices often vary depending on where the prescription is filled and some pharmacies may offer cheaper prices.  The website www.goodrx.com contains coupons for medications through different pharmacies. The prices here do not account for what the cost may be with help from insurance (it may be cheaper with your insurance), but the website can give you the price if you did not use any insurance.  - You can print the associated coupon and take it with your prescription to the pharmacy.  - You may also stop by our office during regular business hours and pick up a GoodRx coupon card.  - If you need your prescription sent electronically to a different pharmacy, notify our office through [facility] MyChart or by phone at [phone] option 4.     Si Usted Necesita Algo Despus de Su Visita  Tambin puede enviarnos un mensaje a travs de MyChart. Por lo general respondemos a los mensajes de MyChart en el transcurso de 1 a 2 das hbiles.  Para renovar recetas, por favor pida a su farmacia que se ponga en contacto con nuestra oficina. Nuestro nmero de fax  es el [phone].  Si tiene un asunto urgente cuando la clnica est cerrada y que no puede esperar hasta el siguiente da hbil, puede llamar/localizar a su doctor(a) al nmero que aparece a continuacin.   Por favor, tenga en cuenta que aunque hacemos todo lo posible para estar disponibles para asuntos urgentes fuera del horario de oficina, no estamos disponibles las 24 horas del da, los 7 das de la semana.   Si tiene un problema urgente y no puede comunicarse con nosotros, puede optar por buscar atencin mdica  en el consultorio de su doctor(a), en una clnica privada, en un centro de atencin urgente o en una sala de emergencias.  Si tiene una emergencia mdica, por favor llame inmediatamente al 911 o vaya a la sala de emergencias.  Nmeros de bper  - Dr. Kowalski: [phone]  - Dra. Moye: [phone]  - Dra. Stewart: [phone]  En caso de inclemencias   del tiempo, por favor llame a nuestra lnea principal al [phone] para una actualizacin sobre el estado de cualquier retraso o cierre.  Consejos para la medicacin en dermatologa: Por favor, guarde las cajas en las que vienen los medicamentos de uso tpico para ayudarle a seguir las instrucciones sobre dnde y cmo usarlos. Las farmacias generalmente imprimen las instrucciones del medicamento slo en las cajas y no directamente en los tubos del medicamento.   Si su medicamento es muy caro, por favor, pngase en contacto con nuestra oficina llamando al [phone] y presione la opcin 4 o envenos un mensaje a travs de MyChart.   No podemos decirle cul ser su copago por los medicamentos por adelantado ya que esto es diferente dependiendo de la cobertura de su seguro. Sin embargo, es posible que podamos encontrar un medicamento sustituto a menor costo o llenar un formulario para que el seguro cubra el medicamento que se considera necesario.   Si se requiere una autorizacin previa para que su compaa de seguros cubra  su medicamento, por favor permtanos de 1 a 2 das hbiles para completar este proceso.  Los precios de los medicamentos varan con frecuencia dependiendo del lugar de dnde se surte la receta y alguna farmacias pueden ofrecer precios ms baratos.  El sitio web www.goodrx.com tiene cupones para medicamentos de diferentes farmacias. Los precios aqu no tienen en cuenta lo que podra costar con la ayuda del seguro (puede ser ms barato con su seguro), pero el sitio web puede darle el precio si no utiliz ningn seguro.  - Puede imprimir el cupn correspondiente y llevarlo con su receta a la farmacia.  - Tambin puede pasar por nuestra oficina durante el horario de atencin regular y recoger una tarjeta de cupones de GoodRx.  - Si necesita que su receta se enve electrnicamente a una farmacia diferente, informe a nuestra oficina a travs de MyChart de [facility] o por telfono llamando al [phone] y presione la opcin 4.  

## 2023-05-11 ENCOUNTER — Telehealth: Payer: Self-pay

## 2023-05-11 NOTE — Telephone Encounter (Signed)
-----   Message from Willeen Niece, MD sent at 05/10/2023  7:22 PM EDT ----- Skin , right temple scalp MELANOCYTIC NEVUS, INTRADERMAL TYPE  Benign mole  - please call patient

## 2023-05-11 NOTE — Telephone Encounter (Signed)
Advised pt of bx results/sh ?

## 2023-06-27 ENCOUNTER — Other Ambulatory Visit: Payer: Self-pay | Admitting: Medical Genetics

## 2023-06-27 DIAGNOSIS — Z006 Encounter for examination for normal comparison and control in clinical research program: Secondary | ICD-10-CM

## 2023-07-06 ENCOUNTER — Other Ambulatory Visit (HOSPITAL_COMMUNITY)
Admission: RE | Admit: 2023-07-06 | Discharge: 2023-07-06 | Disposition: A | Discharge status: Home / Self Care | Payer: Medicare HMO | Attending: Oncology | Admitting: Oncology

## 2023-07-06 DIAGNOSIS — Z006 Encounter for examination for normal comparison and control in clinical research program: Secondary | ICD-10-CM | POA: Insufficient documentation

## 2023-07-19 LAB — GENECONNECT MOLECULAR SCREEN: Genetic Analysis Overall Interpretation: NEGATIVE

## 2023-07-25 DIAGNOSIS — Z0000 Encounter for general adult medical examination without abnormal findings: Secondary | ICD-10-CM | POA: Diagnosis not present

## 2023-07-25 DIAGNOSIS — E669 Obesity, unspecified: Secondary | ICD-10-CM | POA: Diagnosis not present

## 2023-07-25 DIAGNOSIS — I10 Essential (primary) hypertension: Secondary | ICD-10-CM | POA: Diagnosis not present

## 2023-07-25 DIAGNOSIS — E782 Mixed hyperlipidemia: Secondary | ICD-10-CM | POA: Diagnosis not present

## 2023-07-28 ENCOUNTER — Ambulatory Visit: Payer: Medicare HMO | Admitting: Emergency Medicine

## 2023-08-03 DIAGNOSIS — E782 Mixed hyperlipidemia: Secondary | ICD-10-CM | POA: Diagnosis not present

## 2023-08-03 DIAGNOSIS — I10 Essential (primary) hypertension: Secondary | ICD-10-CM | POA: Diagnosis not present

## 2023-08-22 DIAGNOSIS — I10 Essential (primary) hypertension: Secondary | ICD-10-CM | POA: Diagnosis not present

## 2023-08-22 DIAGNOSIS — R7303 Prediabetes: Secondary | ICD-10-CM | POA: Diagnosis not present

## 2023-08-22 DIAGNOSIS — Z23 Encounter for immunization: Secondary | ICD-10-CM | POA: Diagnosis not present

## 2023-08-22 DIAGNOSIS — Z6830 Body mass index (BMI) 30.0-30.9, adult: Secondary | ICD-10-CM | POA: Diagnosis not present

## 2023-08-22 DIAGNOSIS — Z0000 Encounter for general adult medical examination without abnormal findings: Secondary | ICD-10-CM | POA: Diagnosis not present

## 2024-04-12 DIAGNOSIS — H524 Presbyopia: Secondary | ICD-10-CM | POA: Diagnosis not present

## 2024-05-21 ENCOUNTER — Ambulatory Visit: Payer: Medicare HMO | Admitting: Dermatology

## 2024-05-21 DIAGNOSIS — L570 Actinic keratosis: Secondary | ICD-10-CM

## 2024-05-21 DIAGNOSIS — L719 Rosacea, unspecified: Secondary | ICD-10-CM

## 2024-05-21 DIAGNOSIS — D229 Melanocytic nevi, unspecified: Secondary | ICD-10-CM | POA: Diagnosis not present

## 2024-05-21 DIAGNOSIS — L820 Inflamed seborrheic keratosis: Secondary | ICD-10-CM

## 2024-05-21 DIAGNOSIS — D692 Other nonthrombocytopenic purpura: Secondary | ICD-10-CM

## 2024-05-21 DIAGNOSIS — D1801 Hemangioma of skin and subcutaneous tissue: Secondary | ICD-10-CM

## 2024-05-21 DIAGNOSIS — W908XXA Exposure to other nonionizing radiation, initial encounter: Secondary | ICD-10-CM | POA: Diagnosis not present

## 2024-05-21 DIAGNOSIS — Z1283 Encounter for screening for malignant neoplasm of skin: Secondary | ICD-10-CM | POA: Diagnosis not present

## 2024-05-21 DIAGNOSIS — L578 Other skin changes due to chronic exposure to nonionizing radiation: Secondary | ICD-10-CM | POA: Diagnosis not present

## 2024-05-21 DIAGNOSIS — R238 Other skin changes: Secondary | ICD-10-CM | POA: Diagnosis not present

## 2024-05-21 DIAGNOSIS — D2361 Other benign neoplasm of skin of right upper limb, including shoulder: Secondary | ICD-10-CM | POA: Diagnosis not present

## 2024-05-21 DIAGNOSIS — L814 Other melanin hyperpigmentation: Secondary | ICD-10-CM

## 2024-05-21 DIAGNOSIS — L821 Other seborrheic keratosis: Secondary | ICD-10-CM | POA: Diagnosis not present

## 2024-05-21 DIAGNOSIS — D225 Melanocytic nevi of trunk: Secondary | ICD-10-CM | POA: Diagnosis not present

## 2024-05-21 NOTE — Patient Instructions (Addendum)

## 2024-05-21 NOTE — Progress Notes (Signed)
 Follow-Up Visit   Subjective  Vincent Knapp is a 78 y.o. male who presents for the following: Skin Cancer Screening and Full Body Skin Exam Hx of aks, hx of nevi, hx of sk, hx of rosacea using skin medicinal rosacea cream.  Patient reports some spots on right shoulder, right neck, and left flank.     The patient presents for Total-Body Skin Exam (TBSE) for skin cancer screening and mole check. The patient has spots, moles and lesions to be evaluated, some may be new or changing and the patient may have concern these could be cancer.    The following portions of the chart were reviewed this encounter and updated as appropriate: medications, allergies, medical history  Review of Systems:  No other skin or systemic complaints except as noted in HPI or Assessment and Plan.  Objective  Well appearing patient in no apparent distress; mood and affect are within normal limits.  A full examination was performed including scalp, head, eyes, ears, nose, lips, neck, chest, axillae, abdomen, back, buttocks, bilateral upper extremities, bilateral lower extremities, hands, feet, fingers, toes, fingernails, and toenails. All findings within normal limits unless otherwise noted below.   Relevant physical exam findings are noted in the Assessment and Plan.  frontal scalp x 1, left posterior crown x 1 (2) Erythematous thin papules/macules with gritty scale.  right temporal hairline x 1, left flank x 1, right chest x 1, posterior neck x 1 (4) Erythematous stuck-on, waxy papule  Assessment & Plan   SKIN CANCER SCREENING PERFORMED TODAY.  ACTINIC DAMAGE - Chronic condition, secondary to cumulative UV/sun exposure - diffuse scaly erythematous macules with underlying dyspigmentation - Recommend daily broad spectrum sunscreen SPF 30+ to sun-exposed areas, reapply every 2 hours as needed.  - Staying in the shade or wearing long sleeves, sun glasses (UVA+UVB protection) and wide brim hats (4-inch brim  around the entire circumference of the hat) are also recommended for sun protection.  - Call for new or changing lesions.  LENTIGINES, SEBORRHEIC KERATOSES, HEMANGIOMAS - Benign normal skin lesions - Benign-appearing - Call for any changes  MELANOCYTIC NEVI -  3.0 x 2.0 mm medium dark brown macule, slightly waxy of the left lower flank, stable  - Tan-brown and/or pink-flesh-colored symmetric macules and papules - Benign appearing on exam today - Observation - Call clinic for new or changing moles - Recommend daily use of broad spectrum spf 30+ sunscreen to sun-exposed areas.   ISK vs irritated nevus vs dermatofibroma  -Left anterior low flank  at waistline 4 mm slightly firm tan papule mild scale  -Discussed treating with Ln2 if irritated, pt defers at this time -Benign-appearing.  Observation.  Call clinic for new or changing lesions.  Recommend daily use of broad spectrum spf 30+ sunscreen to sun-exposed areas.   BLUE NEVUS Exam: 1.5 mm gray blue macule of the right hand dorsum, stable   Treatment Plan: Benign appearing on exam today. Recommend observation. Call clinic for new or changing moles. Recommend daily use of broad spectrum spf 30+ sunscreen to sun-exposed areas.   NAIL PURPURA Left 3rd fingernail  Exam: purpura at left proximal 3rd fingernail, h/o trauma to finger couple weeks ago  Treatment Plan: Benign-appearing.  Observation.  Call clinic for new or changing lesions.     Sebaceous Hyperplasia - Small yellow papules with a central dell face - Benign-appearing - Observe. Call for changes.   ROSACEA Exam: Mild erythema at nasal tip, pt states better with new cream  Chronic  and persistent condition with duration or expected duration over one year. Condition is improving with treatment but not currently at goal.      Rosacea is a chronic progressive skin condition usually affecting the face of adults, causing redness and/or acne bumps. It is treatable but not  curable. It sometimes affects the eyes (ocular rosacea) as well. It may respond to topical and/or systemic medication and can flare with stress, sun exposure, alcohol, exercise, topical steroids (including hydrocortisone/cortisone 10) and some foods.  Daily application of broad spectrum spf 30+ sunscreen to face is recommended to reduce flares.   Patient denies grittiness of the eyes   Treatment Plan Continue  Azelaic Acid: 15%, Ivermectin: 1%, Metronidazole : 1% Cream - apply to face nightly for rosacea. Rx sent to [facility].  Recommend daily broad spectrum sunscreen SPF 30+ face, reapply every 2 hours as needed. Call for new or changing lesions.       EXCORIATION Exam: Excoriation at right posterior lower neck Treatment Plan: Recommend vaseline. Call if not resolving.   ACTINIC KERATOSIS (2) frontal scalp x 1, left posterior crown x 1 (2) Actinic keratoses are precancerous spots that appear secondary to cumulative UV radiation exposure/sun exposure over time. They are chronic with expected duration over 1 year. A portion of actinic keratoses will progress to squamous cell carcinoma of the skin. It is not possible to reliably predict which spots will progress to skin cancer and so treatment is recommended to prevent development of skin cancer.  Recommend daily broad spectrum sunscreen SPF 30+ to sun-exposed areas, reapply every 2 hours as needed.  Recommend staying in the shade or wearing long sleeves, sun glasses (UVA+UVB protection) and wide brim hats (4-inch brim around the entire circumference of the hat). Call for new or changing lesions. Destruction of lesion - frontal scalp x 1, left posterior crown x 1 (2)  Destruction method: cryotherapy   Informed consent: discussed and consent obtained   Timeout:  patient name, date of birth, surgical site, and procedure verified Lesion destroyed using liquid nitrogen: Yes   Region frozen until ice ball extended beyond lesion: Yes    Outcome: patient tolerated procedure well with no complications   Post-procedure details: wound care instructions given   Additional details:  Prior to procedure, discussed risks of blister formation, small wound, skin dyspigmentation, or rare scar following cryotherapy. Recommend Vaseline ointment to treated areas while healing.   INFLAMED SEBORRHEIC KERATOSIS (4) right temporal hairline x 1, left flank x 1, right chest x 1, posterior neck x 1 (4) Symptomatic, irritating, patient would like treated. Destruction of lesion - right temporal hairline x 1, left flank x 1, right chest x 1, posterior neck x 1 (4)  Destruction method: cryotherapy   Informed consent: discussed and consent obtained   Lesion destroyed using liquid nitrogen: Yes   Region frozen until ice ball extended beyond lesion: Yes   Outcome: patient tolerated procedure well with no complications   Post-procedure details: wound care instructions given   Additional details:  Prior to procedure, discussed risks of blister formation, small wound, skin dyspigmentation, or rare scar following cryotherapy. Recommend Vaseline ointment to treated areas while healing.   Return in about 1 year (around 05/21/2025) for TBSE.  I, Eleanor Blush, CMA, am acting as scribe for Rexene Rattler, MD.   Documentation: I have reviewed the above documentation for accuracy and completeness, and I agree with the above.  Rexene Rattler, MD

## 2024-06-25 ENCOUNTER — Ambulatory Visit: Admitting: Dermatology

## 2024-06-25 ENCOUNTER — Telehealth: Payer: Self-pay

## 2024-06-25 ENCOUNTER — Encounter: Payer: Self-pay | Admitting: Dermatology

## 2024-06-25 DIAGNOSIS — L814 Other melanin hyperpigmentation: Secondary | ICD-10-CM | POA: Diagnosis not present

## 2024-06-25 DIAGNOSIS — L820 Inflamed seborrheic keratosis: Secondary | ICD-10-CM

## 2024-06-25 DIAGNOSIS — L578 Other skin changes due to chronic exposure to nonionizing radiation: Secondary | ICD-10-CM

## 2024-06-25 DIAGNOSIS — L821 Other seborrheic keratosis: Secondary | ICD-10-CM | POA: Diagnosis not present

## 2024-06-25 DIAGNOSIS — W908XXA Exposure to other nonionizing radiation, initial encounter: Secondary | ICD-10-CM

## 2024-06-25 NOTE — Progress Notes (Signed)
   Follow-Up Visit   Subjective  Vincent Knapp is a 78 y.o. male who presents for the following: check mole on face Noticed since last visit A little tender and bumpy. Irritated by shaving     The following portions of the chart were reviewed this encounter and updated as appropriate: medications, allergies, medical history  Review of Systems:  No other skin or systemic complaints except as noted in HPI or Assessment and Plan.  Objective  Well appearing patient in no apparent distress; mood and affect are within normal limits.   A focused examination was performed of the following areas: face  Relevant exam findings are noted in the Assessment and Plan.  left cheek x 1 Erythematous stuck-on, waxy papule  Assessment & Plan   SEBORRHEIC KERATOSIS - Stuck-on, waxy, tan-brown papules face  - Benign-appearing - Discussed benign etiology and prognosis. - Observe - Call for any changes  LENTIGINES Exam: scattered tan macules face Due to sun exposure Treatment Plan: Benign-appearing, observe. Recommend daily broad spectrum sunscreen SPF 30+ to sun-exposed areas, reapply every 2 hours as needed.  Call for any changes    ACTINIC DAMAGE - chronic, secondary to cumulative UV radiation exposure/sun exposure over time - diffuse scaly erythematous macules with underlying dyspigmentation - Recommend daily broad spectrum sunscreen SPF 30+ to sun-exposed areas, reapply every 2 hours as needed.  - Recommend staying in the shade or wearing long sleeves, sun glasses (UVA+UVB protection) and wide brim hats (4-inch brim around the entire circumference of the hat). - Call for new or changing lesions.   INFLAMED SEBORRHEIC KERATOSIS left cheek x 1 Symptomatic, irritating, patient would like treated. Destruction of lesion - left cheek x 1  Destruction method: cryotherapy   Informed consent: discussed and consent obtained   Lesion destroyed using liquid nitrogen: Yes   Region frozen  until ice ball extended beyond lesion: Yes   Outcome: patient tolerated procedure well with no complications   Post-procedure details: wound care instructions given   Additional details:  Prior to procedure, discussed risks of blister formation, small wound, skin dyspigmentation, or rare scar following cryotherapy. Recommend Vaseline ointment to treated areas while healing.    Return for keep followup as scheduled .  I, Eleanor Blush, CMA, am acting as scribe for Rexene Rattler, MD.   Documentation: I have reviewed the above documentation for accuracy and completeness, and I agree with the above.  Rexene Rattler, MD

## 2024-06-25 NOTE — Telephone Encounter (Signed)
 Pt sent mychart message about a new mole that was painful on face.  Called patient and scheduled him for an appointment today at 11:00am./sh

## 2024-06-25 NOTE — Patient Instructions (Addendum)
 Seborrheic Keratosis  What causes seborrheic keratoses? Seborrheic keratoses are harmless, common skin growths that first appear during adult life.  As time goes by, more growths appear.  Some people may develop a large number of them.  Seborrheic keratoses appear on both covered and uncovered body parts.  They are not caused by sunlight.  The tendency to develop seborrheic keratoses can be inherited.  They vary in color from skin-colored to gray, brown, or even black.  They can be either smooth or have a rough, warty surface.   Seborrheic keratoses are superficial and look as if they were stuck on the skin.  Under the microscope this type of keratosis looks like layers upon layers of skin.  That is why at times the top layer may seem to fall off, but the rest of the growth remains and re-grows.    Treatment Seborrheic keratoses do not need to be treated, but can easily be removed in the office.  Seborrheic keratoses often cause symptoms when they rub on clothing or jewelry.  Lesions can be in the way of shaving.  If they become inflamed, they can cause itching, soreness, or burning.  Removal of a seborrheic keratosis can be accomplished by freezing, burning, or surgery. If any spot bleeds, scabs, or grows rapidly, please return to have it checked, as these can be an indication of a skin cancer.   Cryotherapy Aftercare  Wash gently with soap and water everyday.   Apply Vaseline and Band-Aid daily until healed.     Due to recent changes in healthcare laws, you may see results of your pathology and/or laboratory studies on MyChart before the doctors have had a chance to review them. We understand that in some cases there may be results that are confusing or concerning to you. Please understand that not all results are received at the same time and often the doctors may need to interpret multiple results in order to provide you with the best plan of care or course of treatment. Therefore, we ask  that you please give us  2 business days to thoroughly review all your results before contacting the office for clarification. Should we see a critical lab result, you will be contacted sooner.   If You Need Anything After Your Visit  If you have any questions or concerns for your doctor, please call our main line at [phone] and press option 4 to reach your doctor's medical assistant. If no one answers, please leave a voicemail as directed and we will return your call as soon as possible. Messages left after 4 pm will be answered the following business day.   You may also send us  a message via MyChart. We typically respond to MyChart messages within 1-2 business days.  For prescription refills, please ask your pharmacy to contact our office. Our fax number is [phone].  If you have an urgent issue when the clinic is closed that cannot wait until the next business day, you can page your doctor at the number below.    Please note that while we do our best to be available for urgent issues outside of office hours, we are not available 24/7.   If you have an urgent issue and are unable to reach us , you may choose to seek medical care at your doctor's office, retail clinic, urgent care center, or emergency room.  If you have a medical emergency, please immediately call 911 or go to the emergency department.  Pager Numbers  - Dr. Hester:  [phone]  - Dr. Jackquline: [phone]  - Dr. Claudene: [phone]   - Dr. Raymund: [phone]  In the event of inclement weather, please call our main line at [phone] for an update on the status of any delays or closures.  Dermatology Medication Tips: Please keep the boxes that topical medications come in in order to help keep track of the instructions about where and how to use these. Pharmacies typically print the medication instructions only on the boxes and not directly on the medication tubes.   If your medication is too expensive,  please contact our office at [phone] option 4 or send us  a message through MyChart.   We are unable to tell what your co-pay for medications will be in advance as this is different depending on your insurance coverage. However, we may be able to find a substitute medication at lower cost or fill out paperwork to get insurance to cover a needed medication.   If a prior authorization is required to get your medication covered by your insurance company, please allow us  1-2 business days to complete this process.  Drug prices often vary depending on where the prescription is filled and some pharmacies may offer cheaper prices.  The website www.goodrx.com contains coupons for medications through different pharmacies. The prices here do not account for what the cost may be with help from insurance (it may be cheaper with your insurance), but the website can give you the price if you did not use any insurance.  - You can print the associated coupon and take it with your prescription to the pharmacy.  - You may also stop by our office during regular business hours and pick up a GoodRx coupon card.  - If you need your prescription sent electronically to a different pharmacy, notify our office through [facility] or by phone at [phone] option 4.     Si Usted Necesita Algo Despus de Su Visita  Tambin puede enviarnos un mensaje a travs de Clinical cytogeneticist. Por lo general respondemos a los mensajes de MyChart en el transcurso de 1 a 2 das hbiles.  Para renovar recetas, por favor pida a su farmacia que se ponga en contacto con nuestra oficina. Randi lakes de fax es [place] [phone].  Si tiene un asunto urgente cuando la clnica est cerrada y que no puede esperar hasta el siguiente da hbil, puede llamar/localizar a su doctor(a) al nmero que aparece a continuacin.   Por favor, tenga en cuenta que aunque hacemos todo lo posible para estar disponibles para asuntos urgentes fuera del  horario de [place], no estamos disponibles las 24 horas del da, los 7 [address]  [address] de la [place].   Si tiene un problema urgente y no puede comunicarse con nosotros, puede optar por buscar atencin mdica  en el consultorio de su doctor(a), en una clnica privada, en un centro de atencin urgente o en una sala de emergencias.  Si tiene Engineer, drilling, por favor llame inmediatamente al 911 o vaya a la sala de emergencias.  Nmeros de bper  - Dr. Hester: [phone]  - Dra. Jackquline: [phone]  - Dr. Claudene: [phone]  - Dra. Kitts: [phone]  En caso de inclemencias del [place], por favor llame a nuestra lnea principal al [phone] para una actualizacin sobre el estado de cualquier retraso o cierre.  Consejos para la medicacin en dermatologa: Por favor, guarde las cajas en las que vienen los medicamentos de uso tpico para ayudarle a seguir las instrucciones sobre dnde y cmo  usarlos. Las farmacias generalmente imprimen las instrucciones del medicamento slo en las cajas y no directamente en los tubos del [place].   Si su medicamento es muy caro, por favor, pngase en contacto con landry rieger llamando al [phone] y presione la opcin 4 o envenos un mensaje a travs de Clinical cytogeneticist.   No podemos decirle cul ser su copago por los medicamentos por adelantado ya que esto es diferente dependiendo de la cobertura de su seguro. Sin embargo, es posible que podamos encontrar un medicamento sustituto a Audiological scientist un formulario para que el seguro cubra el medicamento que se considera necesario.   Si se requiere una autorizacin previa para que su compaa de seguros malta su medicamento, por favor permtanos de 1 a 2 das hbiles para completar este proceso.  Los precios de los medicamentos varan con frecuencia dependiendo del Environmental consultant de dnde se surte la receta y alguna farmacias pueden ofrecer precios ms baratos.  El sitio web www.goodrx.com tiene cupones para  medicamentos de Health and safety inspector. Los precios aqu no tienen en cuenta lo que podra costar con la ayuda del seguro (puede ser ms barato con su seguro), pero el sitio web puede darle el precio si no utiliz Tourist information centre manager.  - Puede imprimir el cupn correspondiente y llevarlo con su receta a la farmacia.  - Tambin puede pasar por nuestra oficina durante el horario de atencin regular y Education officer, museum una tarjeta de cupones de GoodRx.  - Si necesita que su receta se enve electrnicamente a una farmacia diferente, informe a nuestra oficina a travs de MyChart de [place] o por telfono llamando al [phone] y presione la opcin 4.

## 2024-08-21 DIAGNOSIS — Z1331 Encounter for screening for depression: Secondary | ICD-10-CM | POA: Diagnosis not present

## 2024-08-21 DIAGNOSIS — D696 Thrombocytopenia, unspecified: Secondary | ICD-10-CM | POA: Diagnosis not present

## 2024-08-21 DIAGNOSIS — I10 Essential (primary) hypertension: Secondary | ICD-10-CM | POA: Diagnosis not present

## 2024-08-21 DIAGNOSIS — Z0000 Encounter for general adult medical examination without abnormal findings: Secondary | ICD-10-CM | POA: Diagnosis not present

## 2024-08-21 DIAGNOSIS — E782 Mixed hyperlipidemia: Secondary | ICD-10-CM | POA: Diagnosis not present

## 2024-08-21 DIAGNOSIS — R7303 Prediabetes: Secondary | ICD-10-CM | POA: Diagnosis not present

## 2024-10-16 DIAGNOSIS — I10 Essential (primary) hypertension: Secondary | ICD-10-CM | POA: Diagnosis not present

## 2024-11-28 NOTE — Progress Notes (Signed)
 Vincent Knapp                                          MRN: [id number]   11/28/2024   The [facility] Quality Team Specialist reviewed this patient medical record for the purposes of chart review for care gap closure. The following were reviewed: abstraction for care gap closure-controlling blood pressure.    [facility] Quality Team

## 2025-06-03 ENCOUNTER — Encounter: Admitting: Dermatology
